# Patient Record
Sex: Male | Born: 1945 | Race: White | Hispanic: No | Marital: Married | Smoking: Former smoker
Health system: Southern US, Community
[De-identification: ages and names within clinical notes are randomized; demographics above are authoritative.]

## PROBLEM LIST (undated history)

## (undated) DIAGNOSIS — I10 Essential (primary) hypertension: Secondary | ICD-10-CM

## (undated) DIAGNOSIS — T7840XA Allergy, unspecified, initial encounter: Secondary | ICD-10-CM

## (undated) DIAGNOSIS — F329 Major depressive disorder, single episode, unspecified: Secondary | ICD-10-CM

## (undated) DIAGNOSIS — J45909 Unspecified asthma, uncomplicated: Secondary | ICD-10-CM

## (undated) DIAGNOSIS — F32A Depression, unspecified: Secondary | ICD-10-CM

## (undated) DIAGNOSIS — G4730 Sleep apnea, unspecified: Secondary | ICD-10-CM

## (undated) DIAGNOSIS — M1990 Unspecified osteoarthritis, unspecified site: Secondary | ICD-10-CM

## (undated) DIAGNOSIS — E785 Hyperlipidemia, unspecified: Secondary | ICD-10-CM

## (undated) DIAGNOSIS — I4819 Other persistent atrial fibrillation: Secondary | ICD-10-CM

## (undated) DIAGNOSIS — K432 Incisional hernia without obstruction or gangrene: Secondary | ICD-10-CM

## (undated) DIAGNOSIS — R7303 Prediabetes: Secondary | ICD-10-CM

## (undated) DIAGNOSIS — N209 Urinary calculus, unspecified: Secondary | ICD-10-CM

## (undated) DIAGNOSIS — J189 Pneumonia, unspecified organism: Secondary | ICD-10-CM

## (undated) DIAGNOSIS — N403 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: Secondary | ICD-10-CM

## (undated) HISTORY — DX: Major depressive disorder, single episode, unspecified: F32.9

## (undated) HISTORY — DX: Unspecified osteoarthritis, unspecified site: M19.90

## (undated) HISTORY — DX: Incisional hernia without obstruction or gangrene: K43.2

## (undated) HISTORY — DX: Depression, unspecified: F32.A

## (undated) HISTORY — PX: KNEE ARTHROSCOPY W/ ACL RECONSTRUCTION: SHX1858

## (undated) HISTORY — DX: Allergy, unspecified, initial encounter: T78.40XA

## (undated) HISTORY — PX: OTHER SURGICAL HISTORY: SHX169

## (undated) HISTORY — PX: TONSILLECTOMY: SUR1361

---

## 1989-11-04 HISTORY — PX: SPLENECTOMY: SUR1306

## 2006-07-21 ENCOUNTER — Ambulatory Visit: Payer: Self-pay | Admitting: Gastroenterology

## 2009-03-29 ENCOUNTER — Ambulatory Visit: Payer: Self-pay | Admitting: Internal Medicine

## 2011-07-06 HISTORY — PX: COLONOSCOPY: SHX174

## 2011-07-10 LAB — HM COLONOSCOPY: HM Colonoscopy: NORMAL

## 2011-07-25 HISTORY — PX: ELBOW SURGERY: SHX618

## 2011-09-12 ENCOUNTER — Telehealth: Payer: Self-pay | Admitting: Internal Medicine

## 2011-09-12 ENCOUNTER — Encounter: Payer: Self-pay | Admitting: Internal Medicine

## 2011-09-12 NOTE — Telephone Encounter (Signed)
Cell phone  [phone]   Pt wife (gayle) called to get James Logan in today ortomorrow he has sinus infection?  Nose/eyes burning run/teeth hurt/headache/chest congestion Please advise where i can put patient Pt has not been seen in this office yet

## 2011-09-12 NOTE — Telephone Encounter (Signed)
I talked to patients wife and I am waiting on her to call me back to see if patient wants to keep the appt that was made for tomorrow.

## 2011-09-13 ENCOUNTER — Encounter: Payer: Self-pay | Admitting: Internal Medicine

## 2011-09-13 ENCOUNTER — Ambulatory Visit (INDEPENDENT_AMBULATORY_CARE_PROVIDER_SITE_OTHER): Payer: Medicare Other | Admitting: Internal Medicine

## 2011-09-13 VITALS — BP 132/82 | HR 71 | Temp 98.4°F | Wt 174.0 lb

## 2011-09-13 DIAGNOSIS — J329 Chronic sinusitis, unspecified: Secondary | ICD-10-CM

## 2011-09-13 MED ORDER — AZITHROMYCIN 250 MG PO TABS: ORAL_TABLET | ORAL | Status: AC

## 2011-09-13 MED ORDER — GUAIFENESIN-CODEINE 100-10 MG/5ML PO SYRP: 5.0000 mL | ORAL_SOLUTION | Freq: Two times a day (BID) | ORAL | Status: DC | PRN

## 2011-09-13 NOTE — Telephone Encounter (Signed)
I tried to call patient again with no succuss.

## 2011-09-13 NOTE — Patient Instructions (Signed)
Call next week if symptoms not improving.

## 2011-09-13 NOTE — Progress Notes (Signed)
  Subjective:    Patient ID: James Logan, male    DOB: 1946/02/02, 65 y.o.   MRN: [id number]  Sinusitis This is a new problem. The current episode started 1 to 4 weeks ago. The problem has been gradually worsening since onset. The pain is moderate. Associated symptoms include congestion, coughing, shortness of breath, sinus pressure and a sore throat. Pertinent negatives include no chills, ear pain or neck pain. Past treatments include acetaminophen, oral decongestants and spray decongestants. The treatment provided no relief.     Outpatient Encounter Prescriptions as of 09/13/2011  Medication Sig Dispense Refill  . B Complex-C-Folic Acid (MULTIVITAMIN, STRESS FORMULA) tablet Take 1 tablet by mouth daily.        . citalopram (CELEXA) 20 MG tablet Take 20 mg by mouth daily.        . meloxicam (MOBIC) 15 MG tablet Take 15 mg by mouth daily.        [name] omeprazole (PRILOSEC OTC) 20 MG tablet Take 20 mg by mouth daily.          Review of Systems  Constitutional: Negative for fever, chills, activity change, appetite change, fatigue and unexpected weight change.  HENT: Positive for congestion, sore throat, rhinorrhea, postnasal drip and sinus pressure. Negative for ear pain and neck pain.   Eyes: Negative for visual disturbance.  Respiratory: Positive for cough and shortness of breath.   Cardiovascular: Negative for chest pain.  Gastrointestinal: Negative for abdominal pain and abdominal distention.  Skin: Negative for color change and rash.  Hematological: Negative for adenopathy.  Psychiatric/Behavioral: Negative for sleep disturbance.   BP 132/82  Pulse 71  Temp(Src) 98.4 F (36.9 C) (Oral)  Wt 174 lb (78.926 kg)  SpO2 94%     Objective:   Physical Exam  Constitutional: He is oriented to person, place, and time. He appears well-developed and well-nourished. No distress.  HENT:  Head: Normocephalic and atraumatic.  Right Ear: Tympanic membrane and external ear normal.  Left Ear:  External ear normal. A middle ear effusion is present.  Nose: Mucosal edema present.  Mouth/Throat: Oropharynx is clear and moist. No oropharyngeal exudate or posterior oropharyngeal erythema.  Eyes: Conjunctivae and EOM are normal. Pupils are equal, round, and reactive to light. Right eye exhibits no discharge. Left eye exhibits no discharge. No scleral icterus.  Neck: Normal range of motion. Neck supple. No tracheal deviation present. No thyromegaly present.  Cardiovascular: Normal rate, regular rhythm and normal heart sounds.  Exam reveals no gallop and no friction rub.   No murmur heard. Pulmonary/Chest: Effort normal and breath sounds normal. No respiratory distress. He has no wheezes. He has no rales. He exhibits no tenderness.  Musculoskeletal: Normal range of motion. He exhibits no edema.  Lymphadenopathy:    He has no cervical adenopathy.  Neurological: He is alert and oriented to person, place, and time. No cranial nerve deficit. Coordination normal.  Skin: Skin is warm and dry. No rash noted. He is not diaphoretic. No erythema. No pallor.  Psychiatric: He has a normal mood and affect. His behavior is normal. Judgment and thought content normal.          Assessment & Plan:  1. Sinusitis - Will treat with azithromycin.  Will continue mucinex and use codeine as needed for cough. Follow up if no improvement over next 24-48hr.

## 2011-10-14 ENCOUNTER — Telehealth: Payer: Self-pay | Admitting: Internal Medicine

## 2011-10-14 NOTE — Telephone Encounter (Signed)
ERROR

## 2011-10-14 NOTE — Telephone Encounter (Signed)
Left message asking patient to return my call.

## 2011-10-14 NOTE — Telephone Encounter (Signed)
Patient needing something else for his cough and cold . The z-pak did not work.

## 2011-10-14 NOTE — Telephone Encounter (Signed)
Patients wife called back and made an appt for Thursday.

## 2011-10-14 NOTE — Telephone Encounter (Signed)
Patient has been seen in the office for a cold and cough but not getting any better can something be called into the pharmacy to help. Patient wife will be at this number until 12:00 today.

## 2011-10-14 NOTE — Telephone Encounter (Signed)
Dr.  Dan Humphreys treated him over a month ago for sinus infectio so he cannot be treated with being seen

## 2011-10-17 ENCOUNTER — Ambulatory Visit (INDEPENDENT_AMBULATORY_CARE_PROVIDER_SITE_OTHER): Payer: Medicare Other | Admitting: Internal Medicine

## 2011-10-17 ENCOUNTER — Encounter: Payer: Self-pay | Admitting: Internal Medicine

## 2011-10-17 VITALS — BP 138/70 | HR 73 | Temp 98.4°F | Resp 18 | Ht 67.0 in | Wt 173.0 lb

## 2011-10-17 DIAGNOSIS — F32A Depression, unspecified: Secondary | ICD-10-CM

## 2011-10-17 DIAGNOSIS — F329 Major depressive disorder, single episode, unspecified: Secondary | ICD-10-CM

## 2011-10-17 DIAGNOSIS — J329 Chronic sinusitis, unspecified: Secondary | ICD-10-CM

## 2011-10-17 MED ORDER — PREDNISONE (PAK) 10 MG PO TABS: ORAL_TABLET | ORAL | Status: AC

## 2011-10-17 MED ORDER — HYDROCOD POLST-CHLORPHEN POLST 10-8 MG/5ML PO LQCR: 10.0000 mL | Freq: Every evening | ORAL | Status: DC | PRN

## 2011-10-17 MED ORDER — LEVOFLOXACIN 500 MG PO TABS: 500.0000 mg | ORAL_TABLET | Freq: Every day | ORAL | Status: AC

## 2011-10-17 MED ORDER — CLINDAMYCIN HCL 300 MG PO CAPS: 300.0000 mg | ORAL_CAPSULE | Freq: Three times a day (TID) | ORAL | Status: AC

## 2011-10-17 NOTE — Patient Instructions (Signed)
I am treating your for sinus infection again,  With stronger antibiotics .  The prednisone taper will help with inflammation and ear congestion  Take all of your antibiotics for 10 days

## 2011-10-17 NOTE — Progress Notes (Signed)
  Subjective:    Patient ID: James Logan, male    DOB: 10-Jun-1946, 65 y.o.   MRN: [id number]  HPI  TrMr. Logan is a 65 yr old white male with a history of depressive disorder. Managed with medications, who was treated last month for sinutiis with azithromycin but with incompletel resolution.  He states that his symptoms never improved significantly and reports persistent headache, purulent sinus discharge,  And nocutrnal coughing which is keeping him awake.  He feels general;y miserable but denies myalgias, fevers and dyspnea.   Past Medical History  Diagnosis Date  . Incisional hernia     ventral  . Arthritis   . Depression    Current Outpatient Prescriptions on File Prior to Visit  Medication Sig Dispense Refill  . B Complex-C-Folic Acid (MULTIVITAMIN, STRESS FORMULA) tablet Take 1 tablet by mouth daily.        . citalopram (CELEXA) 20 MG tablet Take 20 mg by mouth daily.        . meloxicam (MOBIC) 15 MG tablet Take 15 mg by mouth daily.        [name] omeprazole (PRILOSEC OTC) 20 MG tablet Take 20 mg by mouth daily.             Review of Systems  Constitutional: Negative for fever, chills, activity change, appetite change, fatigue and unexpected weight change.  HENT: Positive for congestion, sore throat, rhinorrhea, postnasal drip and sinus pressure. Negative for ear pain and neck pain.   Eyes: Negative for visual disturbance.  Respiratory: Positive for cough and shortness of breath.   Cardiovascular: Negative for chest pain.  Gastrointestinal: Negative for abdominal pain and abdominal distention.  Skin: Negative for color change and rash.  Hematological: Negative for adenopathy.  Psychiatric/Behavioral: Negative for sleep disturbance.          BP 138/70  Pulse 73  Temp(Src) 98.4 F (36.9 C) (Oral)  Resp 18  Ht 5\' 7"  (1.702 m)  Wt 173 lb (78.472 kg)  BMI 27.10 kg/m2  SpO2 95%  Objective:   Physical Exam  Constitutional: He is oriented to person, place, and time. He  appears well-developed and well-nourished. No distress.  HENT:  Head: Normocephalic and atraumatic.  Right Ear: Tympanic membrane and external ear normal.  Left Ear: External ear normal. A middle ear effusion is present.  Nose: Mucosal edema present.  Mouth/Throat: Oropharynx is clear and moist. No oropharyngeal exudate or posterior oropharyngeal erythema.  Eyes: Conjunctivae and EOM are normal. Pupils are equal, round, and reactive to light. Right eye exhibits no discharge. Left eye exhibits no discharge. No scleral icterus.  Neck: Normal range of motion. Neck supple. No tracheal deviation present. No thyromegaly present.  Cardiovascular: Normal rate, regular rhythm and normal heart sounds.  Exam reveals no gallop and no friction rub.   No murmur heard. Pulmonary/Chest: Effort normal and breath sounds normal. No respiratory distress. He has no wheezes. He has no rales. He exhibits no tenderness.  Musculoskeletal: Normal range of motion. He exhibits no edema.  Lymphadenopathy:    He has no cervical adenopathy.  Neurological: He is alert and oriented to person, place, and time. No cranial nerve deficit. Coordination normal.  Skin: Skin is warm and dry. No rash noted. He is not diaphoretic. No erythema. No pallor.  Psychiatric: He has a normal mood and affect. His behavior is normal. Judgment and thought content normal.          Assessment & Plan:

## 2011-10-18 DIAGNOSIS — F329 Major depressive disorder, single episode, unspecified: Secondary | ICD-10-CM | POA: Insufficient documentation

## 2011-10-18 DIAGNOSIS — F32A Depression, unspecified: Secondary | ICD-10-CM | POA: Insufficient documentation

## 2011-10-18 DIAGNOSIS — J0120 Acute ethmoidal sinusitis, unspecified: Secondary | ICD-10-CM | POA: Insufficient documentation

## 2011-10-18 MED ORDER — HYDROCOD POLST-CHLORPHEN POLST 10-8 MG/5ML PO LQCR: 10.0000 mL | Freq: Every evening | ORAL | Status: DC | PRN

## 2011-10-18 NOTE — Assessment & Plan Note (Signed)
Given the chronicity of his symptoms will treat for 10 days with levaquin and clindamycin and add prednisone  for the persistent inflammatory /congestion in his  Eustachian tubes.

## 2011-11-11 ENCOUNTER — Telehealth: Payer: Self-pay | Admitting: Internal Medicine

## 2011-11-11 DIAGNOSIS — J329 Chronic sinusitis, unspecified: Secondary | ICD-10-CM

## 2011-11-11 NOTE — Telephone Encounter (Signed)
I spoke w/wife - Pt was treated w/levaquin & prednisone pak at last OV. Says symptoms became somewhat better but not completely resolved and now are getting worse. C/o productive cough and sinus congestion/pressure w/yellow mucus. NO fever. Pt states that the only day he can come in for OV is tomorrow but there are no open apts. Would you like to work in pt tomorrow?

## 2011-11-11 NOTE — Telephone Encounter (Signed)
No I think he needs to see an ENT doc because he has been through 2 rounds of antibiotics now and he may have a sinus abscess or blocked sinus that is causing the recurrence.  Does he have a preference?

## 2011-11-12 NOTE — Telephone Encounter (Signed)
Referral to [facility] made in [facility]

## 2011-11-12 NOTE — Telephone Encounter (Signed)
Spoke with patient's wife and she says that she doesn't really have a preference, but she did mention [facility] in [place] with Dr. Nelda Marseille.

## 2011-11-12 NOTE — Telephone Encounter (Signed)
Patient's wife notified that referral was being done. Will wait to here from [facility].

## 2011-11-18 DIAGNOSIS — J320 Chronic maxillary sinusitis: Secondary | ICD-10-CM | POA: Diagnosis not present

## 2011-11-18 DIAGNOSIS — R0982 Postnasal drip: Secondary | ICD-10-CM | POA: Diagnosis not present

## 2011-11-18 DIAGNOSIS — R05 Cough: Secondary | ICD-10-CM | POA: Diagnosis not present

## 2011-11-18 DIAGNOSIS — R059 Cough, unspecified: Secondary | ICD-10-CM | POA: Diagnosis not present

## 2011-11-26 DIAGNOSIS — M999 Biomechanical lesion, unspecified: Secondary | ICD-10-CM | POA: Diagnosis not present

## 2011-11-26 DIAGNOSIS — IMO0002 Reserved for concepts with insufficient information to code with codable children: Secondary | ICD-10-CM | POA: Diagnosis not present

## 2011-11-26 DIAGNOSIS — M47814 Spondylosis without myelopathy or radiculopathy, thoracic region: Secondary | ICD-10-CM | POA: Diagnosis not present

## 2011-12-11 ENCOUNTER — Encounter: Payer: Self-pay | Admitting: Internal Medicine

## 2012-01-07 DIAGNOSIS — IMO0002 Reserved for concepts with insufficient information to code with codable children: Secondary | ICD-10-CM | POA: Diagnosis not present

## 2012-01-07 DIAGNOSIS — M999 Biomechanical lesion, unspecified: Secondary | ICD-10-CM | POA: Diagnosis not present

## 2012-01-07 DIAGNOSIS — M47814 Spondylosis without myelopathy or radiculopathy, thoracic region: Secondary | ICD-10-CM | POA: Diagnosis not present

## 2012-01-14 DIAGNOSIS — M999 Biomechanical lesion, unspecified: Secondary | ICD-10-CM | POA: Diagnosis not present

## 2012-01-14 DIAGNOSIS — M47814 Spondylosis without myelopathy or radiculopathy, thoracic region: Secondary | ICD-10-CM | POA: Diagnosis not present

## 2012-01-14 DIAGNOSIS — IMO0002 Reserved for concepts with insufficient information to code with codable children: Secondary | ICD-10-CM | POA: Diagnosis not present

## 2012-02-11 DIAGNOSIS — M47814 Spondylosis without myelopathy or radiculopathy, thoracic region: Secondary | ICD-10-CM | POA: Diagnosis not present

## 2012-02-11 DIAGNOSIS — IMO0002 Reserved for concepts with insufficient information to code with codable children: Secondary | ICD-10-CM | POA: Diagnosis not present

## 2012-02-11 DIAGNOSIS — M999 Biomechanical lesion, unspecified: Secondary | ICD-10-CM | POA: Diagnosis not present

## 2012-03-10 DIAGNOSIS — M999 Biomechanical lesion, unspecified: Secondary | ICD-10-CM | POA: Diagnosis not present

## 2012-03-10 DIAGNOSIS — IMO0002 Reserved for concepts with insufficient information to code with codable children: Secondary | ICD-10-CM | POA: Diagnosis not present

## 2012-03-10 DIAGNOSIS — M47814 Spondylosis without myelopathy or radiculopathy, thoracic region: Secondary | ICD-10-CM | POA: Diagnosis not present

## 2012-03-27 DIAGNOSIS — H169 Unspecified keratitis: Secondary | ICD-10-CM | POA: Diagnosis not present

## 2012-04-01 DIAGNOSIS — H169 Unspecified keratitis: Secondary | ICD-10-CM | POA: Diagnosis not present

## 2012-04-07 DIAGNOSIS — IMO0002 Reserved for concepts with insufficient information to code with codable children: Secondary | ICD-10-CM | POA: Diagnosis not present

## 2012-04-07 DIAGNOSIS — M999 Biomechanical lesion, unspecified: Secondary | ICD-10-CM | POA: Diagnosis not present

## 2012-04-07 DIAGNOSIS — M47814 Spondylosis without myelopathy or radiculopathy, thoracic region: Secondary | ICD-10-CM | POA: Diagnosis not present

## 2012-04-13 DIAGNOSIS — H1045 Other chronic allergic conjunctivitis: Secondary | ICD-10-CM | POA: Diagnosis not present

## 2012-04-20 DIAGNOSIS — H2510 Age-related nuclear cataract, unspecified eye: Secondary | ICD-10-CM | POA: Diagnosis not present

## 2012-04-28 DIAGNOSIS — M999 Biomechanical lesion, unspecified: Secondary | ICD-10-CM | POA: Diagnosis not present

## 2012-04-28 DIAGNOSIS — IMO0002 Reserved for concepts with insufficient information to code with codable children: Secondary | ICD-10-CM | POA: Diagnosis not present

## 2012-04-28 DIAGNOSIS — M47814 Spondylosis without myelopathy or radiculopathy, thoracic region: Secondary | ICD-10-CM | POA: Diagnosis not present

## 2012-05-04 DIAGNOSIS — M999 Biomechanical lesion, unspecified: Secondary | ICD-10-CM | POA: Diagnosis not present

## 2012-05-04 DIAGNOSIS — IMO0002 Reserved for concepts with insufficient information to code with codable children: Secondary | ICD-10-CM | POA: Diagnosis not present

## 2012-05-04 DIAGNOSIS — M47814 Spondylosis without myelopathy or radiculopathy, thoracic region: Secondary | ICD-10-CM | POA: Diagnosis not present

## 2012-05-12 DIAGNOSIS — M531 Cervicobrachial syndrome: Secondary | ICD-10-CM | POA: Diagnosis not present

## 2012-05-12 DIAGNOSIS — M999 Biomechanical lesion, unspecified: Secondary | ICD-10-CM | POA: Diagnosis not present

## 2012-05-12 DIAGNOSIS — IMO0002 Reserved for concepts with insufficient information to code with codable children: Secondary | ICD-10-CM | POA: Diagnosis not present

## 2012-05-12 DIAGNOSIS — M47814 Spondylosis without myelopathy or radiculopathy, thoracic region: Secondary | ICD-10-CM | POA: Diagnosis not present

## 2012-05-12 DIAGNOSIS — M9981 Other biomechanical lesions of cervical region: Secondary | ICD-10-CM | POA: Diagnosis not present

## 2012-05-14 DIAGNOSIS — M531 Cervicobrachial syndrome: Secondary | ICD-10-CM | POA: Diagnosis not present

## 2012-05-14 DIAGNOSIS — M999 Biomechanical lesion, unspecified: Secondary | ICD-10-CM | POA: Diagnosis not present

## 2012-05-14 DIAGNOSIS — M9981 Other biomechanical lesions of cervical region: Secondary | ICD-10-CM | POA: Diagnosis not present

## 2012-05-14 DIAGNOSIS — IMO0002 Reserved for concepts with insufficient information to code with codable children: Secondary | ICD-10-CM | POA: Diagnosis not present

## 2012-05-18 DIAGNOSIS — M999 Biomechanical lesion, unspecified: Secondary | ICD-10-CM | POA: Diagnosis not present

## 2012-05-18 DIAGNOSIS — IMO0002 Reserved for concepts with insufficient information to code with codable children: Secondary | ICD-10-CM | POA: Diagnosis not present

## 2012-05-18 DIAGNOSIS — M9981 Other biomechanical lesions of cervical region: Secondary | ICD-10-CM | POA: Diagnosis not present

## 2012-05-18 DIAGNOSIS — M531 Cervicobrachial syndrome: Secondary | ICD-10-CM | POA: Diagnosis not present

## 2012-05-21 DIAGNOSIS — M531 Cervicobrachial syndrome: Secondary | ICD-10-CM | POA: Diagnosis not present

## 2012-05-21 DIAGNOSIS — IMO0002 Reserved for concepts with insufficient information to code with codable children: Secondary | ICD-10-CM | POA: Diagnosis not present

## 2012-05-21 DIAGNOSIS — M9981 Other biomechanical lesions of cervical region: Secondary | ICD-10-CM | POA: Diagnosis not present

## 2012-05-21 DIAGNOSIS — M999 Biomechanical lesion, unspecified: Secondary | ICD-10-CM | POA: Diagnosis not present

## 2012-05-26 DIAGNOSIS — M999 Biomechanical lesion, unspecified: Secondary | ICD-10-CM | POA: Diagnosis not present

## 2012-05-26 DIAGNOSIS — IMO0002 Reserved for concepts with insufficient information to code with codable children: Secondary | ICD-10-CM | POA: Diagnosis not present

## 2012-05-26 DIAGNOSIS — M531 Cervicobrachial syndrome: Secondary | ICD-10-CM | POA: Diagnosis not present

## 2012-05-26 DIAGNOSIS — M9981 Other biomechanical lesions of cervical region: Secondary | ICD-10-CM | POA: Diagnosis not present

## 2012-06-09 DIAGNOSIS — M999 Biomechanical lesion, unspecified: Secondary | ICD-10-CM | POA: Diagnosis not present

## 2012-06-09 DIAGNOSIS — IMO0002 Reserved for concepts with insufficient information to code with codable children: Secondary | ICD-10-CM | POA: Diagnosis not present

## 2012-06-09 DIAGNOSIS — M531 Cervicobrachial syndrome: Secondary | ICD-10-CM | POA: Diagnosis not present

## 2012-06-09 DIAGNOSIS — M9981 Other biomechanical lesions of cervical region: Secondary | ICD-10-CM | POA: Diagnosis not present

## 2012-06-22 ENCOUNTER — Encounter: Payer: Self-pay | Admitting: Internal Medicine

## 2012-06-22 ENCOUNTER — Ambulatory Visit (INDEPENDENT_AMBULATORY_CARE_PROVIDER_SITE_OTHER): Payer: Medicare Other | Admitting: Internal Medicine

## 2012-06-22 VITALS — BP 152/80 | HR 62 | Temp 97.9°F | Resp 18 | Wt 171.5 lb

## 2012-06-22 DIAGNOSIS — R5383 Other fatigue: Secondary | ICD-10-CM | POA: Diagnosis not present

## 2012-06-22 DIAGNOSIS — E785 Hyperlipidemia, unspecified: Secondary | ICD-10-CM | POA: Diagnosis not present

## 2012-06-22 DIAGNOSIS — R5381 Other malaise: Secondary | ICD-10-CM

## 2012-06-22 DIAGNOSIS — K219 Gastro-esophageal reflux disease without esophagitis: Secondary | ICD-10-CM

## 2012-06-22 DIAGNOSIS — K449 Diaphragmatic hernia without obstruction or gangrene: Secondary | ICD-10-CM

## 2012-06-22 DIAGNOSIS — Z1159 Encounter for screening for other viral diseases: Secondary | ICD-10-CM

## 2012-06-22 DIAGNOSIS — K439 Ventral hernia without obstruction or gangrene: Secondary | ICD-10-CM | POA: Insufficient documentation

## 2012-06-22 LAB — LIPID PANEL
Cholesterol: 189 mg/dL (ref 0–200)
LDL Cholesterol: 108 mg/dL — ABNORMAL HIGH (ref 0–99)
Triglycerides: 179 mg/dL — ABNORMAL HIGH (ref 0.0–149.0)
VLDL: 35.8 mg/dL (ref 0.0–40.0)

## 2012-06-22 LAB — COMPREHENSIVE METABOLIC PANEL
AST: 33 U/L (ref 0–37)
Albumin: 4.2 g/dL (ref 3.5–5.2)
BUN: 24 mg/dL — ABNORMAL HIGH (ref 6–23)
Calcium: 10.2 mg/dL (ref 8.4–10.5)
Chloride: 104 mEq/L (ref 96–112)
Creatinine, Ser: 1 mg/dL (ref 0.4–1.5)
Glucose, Bld: 106 mg/dL — ABNORMAL HIGH (ref 70–99)
Potassium: 4.7 mEq/L (ref 3.5–5.1)

## 2012-06-22 LAB — CBC WITH DIFFERENTIAL/PLATELET
Basophils Relative: 0.8 % (ref 0.0–3.0)
Eosinophils Relative: 2.7 % (ref 0.0–5.0)
Hemoglobin: 16.4 g/dL (ref 13.0–17.0)
Lymphocytes Relative: 42.2 % (ref 12.0–46.0)
Monocytes Relative: 12.5 % — ABNORMAL HIGH (ref 3.0–12.0)
Neutro Abs: 3.3 10*3/uL (ref 1.4–7.7)
Neutrophils Relative %: 41.8 % — ABNORMAL LOW (ref 43.0–77.0)
RBC: 4.89 Mil/uL (ref 4.22–5.81)
WBC: 7.9 10*3/uL (ref 4.5–10.5)

## 2012-06-22 LAB — TSH: TSH: 1.78 u[IU]/mL (ref 0.35–5.50)

## 2012-06-22 MED ORDER — OMEPRAZOLE MAGNESIUM 20 MG PO TBEC: 20.0000 mg | DELAYED_RELEASE_TABLET | Freq: Two times a day (BID) | ORAL | Status: DC

## 2012-06-22 MED ORDER — AZELASTINE HCL 0.15 % NA SOLN: 1.0000 | Freq: Every day | NASAL | Status: DC

## 2012-06-22 MED ORDER — OMEPRAZOLE MAGNESIUM 20 MG PO TBEC: 20.0000 mg | DELAYED_RELEASE_TABLET | Freq: Every day | ORAL | Status: DC

## 2012-06-22 NOTE — Assessment & Plan Note (Signed)
Omeprazole increased to twice daily. Small meals with frequent feeds recommended. Elevating head of the bed and avoiding eating within 2 hours of going to bed all again reinforced. Referral to Dr. Lemar Livings for surgical intervention requested.

## 2012-06-22 NOTE — Patient Instructions (Addendum)
We referring you to Dr. Lemar Livings  For evaluation of your hernias.    We will call you with your lab results.

## 2012-06-22 NOTE — Progress Notes (Signed)
Patient ID: James Logan, male   DOB: 02-Aug-1946, 66 y.o.   MRN: [id number]  Patient Active Problem List  Diagnosis  . Depression  . Chronic sinusitis  . Ventral hernia without obstruction or gangrene  . Hiatal hernia with gastroesophageal reflux    Subjective:  CC:   Chief Complaint  Patient presents with  . Follow-up  . Referral    HPI:   James Logan a 66 y.o. male who presents Abdominal swelling and persistent reflux. Patient presents today with wife with a complaint of a progressively enlarging ventral wall mass. 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  He states that the event he developed a ventral hernia  Around the year 2000  and it has been enlarging over the last several years and he is now concerned about it its size. It is not particular painful .   His second issue is persistent reflux with belching caused by hiatal hernia.   He takes omeprazole daily and has reduce his starches in his diet but continues to have reflux aggravated by bending over.  he is requesting surgical management of both hernias by Dr. Santa Genera.    Past Medical History  Diagnosis Date  . Incisional hernia     ventral  . Arthritis   . Depression     Past Surgical History  Procedure Date  . 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    Review of Systems:   A comprehensive ROS was done and positive for reflex and abdominal distension .   The rest was negative.   History   Social History  . Marital Status: Married    Spouse Name: N/A    Number of Children: N/A  . Years of Education: N/A   Occupational History  . Not on file.     Social History Main Topics  . Smoking status: Former Smoker    Quit date: 10/16/1961  . Smokeless tobacco: Never Used  . Alcohol Use: Yes  . Drug Use: No  . Sexually Active: Not on file   Other Topics Concern  . Not on file   Social History Narrative  . No narrative on file    Objective:  BP 152/80  Pulse 62  Temp 97.9 F (36.6 C) (Oral)  Resp 18  Wt 171 lb 8 oz (77.792 kg)  SpO2 95%  General appearance: alert, cooperative and appears stated age Ears: normal TM's and external ear canals both ears Throat: lips, mucosa, and tongue normal; teeth and gums normal Neck: no adenopathy, no carotid bruit, supple, symmetrical, trachea midline and thyroid not enlarged, symmetric, no tenderness/mass/nodules Back: symmetric, no curvature. ROM normal. No CVA tenderness. Lungs: clear to auscultation bilaterally Heart: regular rate and rhythm, S1, S2 normal, no murmur, click, rub or gallop Abdomen: soft, non-tender; bowel sounds normal; no masses,  no organomegaly Pulses: 2+ and symmetric Skin: Skin color, texture, turgor normal. No rashes or lesions Lymph nodes: Cervical, supraclavicular, and axillary nodes normal.  Assessment and Plan:  Ventral hernia without obstruction or gangrene Referral to [facility] for management of large nonreducible hernia which resulted  from abdominal surgery many years ago. This may require referral to her hernia specialist given its size.  Hiatal hernia with gastroesophageal reflux Omeprazole increased to twice daily. Small meals with frequent feeds recommended. Elevating head of the bed and avoiding eating within 2 hours of going to bed all again reinforced. Referral to Dr. Lemar Livings for surgical intervention requested.   Updated Medication List Outpatient Encounter Prescriptions as of 06/22/2012  Medication Sig Dispense Refill  . B Complex-C-Folic Acid (MULTIVITAMIN, STRESS FORMULA) tablet Take 1 tablet by mouth daily.        . citalopram  (CELEXA) 20 MG tablet Take 20 mg by mouth daily.        . meloxicam (MOBIC) 15 MG tablet Take 15 mg by mouth daily.        [name] omeprazole (PRILOSEC OTC) 20 MG tablet Take 1 tablet (20 mg total) by mouth 2 (two) times daily before a meal. Prior to meals  60 tablet  5  . DISCONTD: omeprazole (PRILOSEC OTC) 20 MG tablet Take 20 mg by mouth daily.       [name] DISCONTD: omeprazole (PRILOSEC OTC) 20 MG tablet Take 1 tablet (20 mg total) by mouth daily. Prior to meals  60 tablet  5  . Azelastine HCl 0.15 % SOLN Place 1 Squirt into the nose daily.  30 mL  11  . DISCONTD: chlorpheniramine-HYDROcodone (TUSSIONEX PENNKINETIC ER) 10-8 MG/5ML LQCR Take 10 mLs by mouth at bedtime as needed (cough).  115 mL  0     Orders Placed This Encounter  Procedures  . Lipid panel  . Comprehensive metabolic panel  . CBC with Differential  . Testosterone, free, total  . TSH  . Hepatitis C Antibody  . Ambulatory referral to General Surgery    No Follow-up on file.

## 2012-06-22 NOTE — Assessment & Plan Note (Signed)
Referral to [facility] for management of large nonreducible hernia which resulted from abdominal surgery many years ago. This may require referral to her hernia specialist given its size.

## 2012-06-23 LAB — TESTOSTERONE, FREE, TOTAL, SHBG
Sex Hormone Binding: 30 nmol/L (ref 13–71)
Testosterone, Free: 65.4 pg/mL (ref 47.0–244.0)
Testosterone-% Free: 2.1 % (ref 1.6–2.9)

## 2012-06-23 LAB — HEPATITIS C ANTIBODY: HCV Ab: NEGATIVE

## 2012-06-25 ENCOUNTER — Other Ambulatory Visit: Payer: Self-pay | Admitting: Internal Medicine

## 2012-06-26 ENCOUNTER — Telehealth: Payer: Self-pay | Admitting: Internal Medicine

## 2012-06-26 DIAGNOSIS — J310 Chronic rhinitis: Secondary | ICD-10-CM

## 2012-06-26 MED ORDER — AZELASTINE HCL 0.15 % NA SOLN: NASAL | Status: DC

## 2012-06-26 NOTE — Telephone Encounter (Signed)
Brooke from [employer] called and stated the insurance will not pay for the Astepro nasal spray because the sig is only for once a day.  She wanted to know if you could change the sig to say: 1-2 sprays into each nostril twice daily.  Please advise and I will update chart with new Rx.

## 2012-06-26 NOTE — Telephone Encounter (Signed)
Non need,  Sent new corrected rx via [facility].  thanks

## 2012-06-30 DIAGNOSIS — IMO0002 Reserved for concepts with insufficient information to code with codable children: Secondary | ICD-10-CM | POA: Diagnosis not present

## 2012-06-30 DIAGNOSIS — M9981 Other biomechanical lesions of cervical region: Secondary | ICD-10-CM | POA: Diagnosis not present

## 2012-06-30 DIAGNOSIS — M999 Biomechanical lesion, unspecified: Secondary | ICD-10-CM | POA: Diagnosis not present

## 2012-06-30 DIAGNOSIS — M531 Cervicobrachial syndrome: Secondary | ICD-10-CM | POA: Diagnosis not present

## 2012-07-13 DIAGNOSIS — K439 Ventral hernia without obstruction or gangrene: Secondary | ICD-10-CM | POA: Diagnosis not present

## 2012-07-13 DIAGNOSIS — M999 Biomechanical lesion, unspecified: Secondary | ICD-10-CM | POA: Diagnosis not present

## 2012-07-13 DIAGNOSIS — IMO0002 Reserved for concepts with insufficient information to code with codable children: Secondary | ICD-10-CM | POA: Diagnosis not present

## 2012-07-13 DIAGNOSIS — K219 Gastro-esophageal reflux disease without esophagitis: Secondary | ICD-10-CM | POA: Diagnosis not present

## 2012-07-13 DIAGNOSIS — M531 Cervicobrachial syndrome: Secondary | ICD-10-CM | POA: Diagnosis not present

## 2012-07-13 DIAGNOSIS — Z23 Encounter for immunization: Secondary | ICD-10-CM | POA: Diagnosis not present

## 2012-07-13 DIAGNOSIS — M9981 Other biomechanical lesions of cervical region: Secondary | ICD-10-CM | POA: Diagnosis not present

## 2012-07-16 DIAGNOSIS — M9981 Other biomechanical lesions of cervical region: Secondary | ICD-10-CM | POA: Diagnosis not present

## 2012-07-16 DIAGNOSIS — M531 Cervicobrachial syndrome: Secondary | ICD-10-CM | POA: Diagnosis not present

## 2012-07-16 DIAGNOSIS — M999 Biomechanical lesion, unspecified: Secondary | ICD-10-CM | POA: Diagnosis not present

## 2012-07-16 DIAGNOSIS — IMO0002 Reserved for concepts with insufficient information to code with codable children: Secondary | ICD-10-CM | POA: Diagnosis not present

## 2012-07-22 DIAGNOSIS — L570 Actinic keratosis: Secondary | ICD-10-CM | POA: Diagnosis not present

## 2012-07-22 DIAGNOSIS — D485 Neoplasm of uncertain behavior of skin: Secondary | ICD-10-CM | POA: Diagnosis not present

## 2012-07-22 DIAGNOSIS — L821 Other seborrheic keratosis: Secondary | ICD-10-CM | POA: Diagnosis not present

## 2012-07-27 ENCOUNTER — Ambulatory Visit: Payer: Self-pay | Admitting: General Surgery

## 2012-07-27 DIAGNOSIS — R109 Unspecified abdominal pain: Secondary | ICD-10-CM | POA: Diagnosis not present

## 2012-07-27 DIAGNOSIS — K219 Gastro-esophageal reflux disease without esophagitis: Secondary | ICD-10-CM | POA: Diagnosis not present

## 2012-08-06 DIAGNOSIS — M999 Biomechanical lesion, unspecified: Secondary | ICD-10-CM | POA: Diagnosis not present

## 2012-08-06 DIAGNOSIS — M531 Cervicobrachial syndrome: Secondary | ICD-10-CM | POA: Diagnosis not present

## 2012-08-06 DIAGNOSIS — M9981 Other biomechanical lesions of cervical region: Secondary | ICD-10-CM | POA: Diagnosis not present

## 2012-08-06 DIAGNOSIS — IMO0002 Reserved for concepts with insufficient information to code with codable children: Secondary | ICD-10-CM | POA: Diagnosis not present

## 2012-08-11 ENCOUNTER — Ambulatory Visit: Payer: Self-pay | Admitting: General Surgery

## 2012-08-11 DIAGNOSIS — K2100 Gastro-esophageal reflux disease with esophagitis, without bleeding: Secondary | ICD-10-CM | POA: Diagnosis not present

## 2012-08-11 DIAGNOSIS — K2940 Chronic atrophic gastritis without bleeding: Secondary | ICD-10-CM | POA: Diagnosis not present

## 2012-08-11 DIAGNOSIS — K2970 Gastritis, unspecified, without bleeding: Secondary | ICD-10-CM | POA: Diagnosis not present

## 2012-08-11 DIAGNOSIS — A048 Other specified bacterial intestinal infections: Secondary | ICD-10-CM | POA: Diagnosis not present

## 2012-08-11 DIAGNOSIS — K219 Gastro-esophageal reflux disease without esophagitis: Secondary | ICD-10-CM | POA: Diagnosis not present

## 2012-08-11 DIAGNOSIS — Z79899 Other long term (current) drug therapy: Secondary | ICD-10-CM | POA: Diagnosis not present

## 2012-08-11 DIAGNOSIS — K449 Diaphragmatic hernia without obstruction or gangrene: Secondary | ICD-10-CM | POA: Diagnosis not present

## 2012-08-11 DIAGNOSIS — K2990 Gastroduodenitis, unspecified, without bleeding: Secondary | ICD-10-CM | POA: Diagnosis not present

## 2012-08-11 DIAGNOSIS — K2960 Other gastritis without bleeding: Secondary | ICD-10-CM | POA: Diagnosis not present

## 2012-08-13 LAB — PATHOLOGY REPORT

## 2012-08-24 DIAGNOSIS — K2940 Chronic atrophic gastritis without bleeding: Secondary | ICD-10-CM | POA: Diagnosis not present

## 2012-08-24 DIAGNOSIS — K439 Ventral hernia without obstruction or gangrene: Secondary | ICD-10-CM | POA: Diagnosis not present

## 2012-08-24 DIAGNOSIS — K2100 Gastro-esophageal reflux disease with esophagitis, without bleeding: Secondary | ICD-10-CM | POA: Diagnosis not present

## 2012-08-31 DIAGNOSIS — IMO0002 Reserved for concepts with insufficient information to code with codable children: Secondary | ICD-10-CM | POA: Diagnosis not present

## 2012-08-31 DIAGNOSIS — M9981 Other biomechanical lesions of cervical region: Secondary | ICD-10-CM | POA: Diagnosis not present

## 2012-08-31 DIAGNOSIS — M999 Biomechanical lesion, unspecified: Secondary | ICD-10-CM | POA: Diagnosis not present

## 2012-08-31 DIAGNOSIS — M531 Cervicobrachial syndrome: Secondary | ICD-10-CM | POA: Diagnosis not present

## 2012-09-03 ENCOUNTER — Ambulatory Visit: Payer: Self-pay | Admitting: General Surgery

## 2012-09-03 DIAGNOSIS — K439 Ventral hernia without obstruction or gangrene: Secondary | ICD-10-CM | POA: Diagnosis not present

## 2012-09-09 ENCOUNTER — Ambulatory Visit: Payer: Self-pay | Admitting: General Surgery

## 2012-09-09 DIAGNOSIS — Z01810 Encounter for preprocedural cardiovascular examination: Secondary | ICD-10-CM

## 2012-09-09 DIAGNOSIS — K439 Ventral hernia without obstruction or gangrene: Secondary | ICD-10-CM | POA: Diagnosis not present

## 2012-09-11 ENCOUNTER — Telehealth: Payer: Self-pay | Admitting: Internal Medicine

## 2012-09-11 NOTE — Telephone Encounter (Signed)
Pt was wanting to speak to Dr. Darrick Huntsman today if possible ??? About a hernia surgery he will be having. He would like a call back to his home phone.

## 2012-09-21 ENCOUNTER — Encounter: Payer: Self-pay | Admitting: Internal Medicine

## 2012-09-30 DIAGNOSIS — M9981 Other biomechanical lesions of cervical region: Secondary | ICD-10-CM | POA: Diagnosis not present

## 2012-09-30 DIAGNOSIS — IMO0002 Reserved for concepts with insufficient information to code with codable children: Secondary | ICD-10-CM | POA: Diagnosis not present

## 2012-09-30 DIAGNOSIS — M999 Biomechanical lesion, unspecified: Secondary | ICD-10-CM | POA: Diagnosis not present

## 2012-09-30 DIAGNOSIS — M531 Cervicobrachial syndrome: Secondary | ICD-10-CM | POA: Diagnosis not present

## 2012-10-21 DIAGNOSIS — K431 Incisional hernia with gangrene: Secondary | ICD-10-CM | POA: Diagnosis not present

## 2012-10-27 DIAGNOSIS — M999 Biomechanical lesion, unspecified: Secondary | ICD-10-CM | POA: Diagnosis not present

## 2012-10-27 DIAGNOSIS — M9981 Other biomechanical lesions of cervical region: Secondary | ICD-10-CM | POA: Diagnosis not present

## 2012-10-27 DIAGNOSIS — M531 Cervicobrachial syndrome: Secondary | ICD-10-CM | POA: Diagnosis not present

## 2012-10-27 DIAGNOSIS — IMO0002 Reserved for concepts with insufficient information to code with codable children: Secondary | ICD-10-CM | POA: Diagnosis not present

## 2012-11-17 DIAGNOSIS — IMO0002 Reserved for concepts with insufficient information to code with codable children: Secondary | ICD-10-CM | POA: Diagnosis not present

## 2012-11-17 DIAGNOSIS — M9981 Other biomechanical lesions of cervical region: Secondary | ICD-10-CM | POA: Diagnosis not present

## 2012-11-17 DIAGNOSIS — M999 Biomechanical lesion, unspecified: Secondary | ICD-10-CM | POA: Diagnosis not present

## 2012-11-17 DIAGNOSIS — M531 Cervicobrachial syndrome: Secondary | ICD-10-CM | POA: Diagnosis not present

## 2012-11-20 DIAGNOSIS — R1084 Generalized abdominal pain: Secondary | ICD-10-CM | POA: Diagnosis not present

## 2012-11-20 DIAGNOSIS — K660 Peritoneal adhesions (postprocedural) (postinfection): Secondary | ICD-10-CM | POA: Diagnosis not present

## 2012-11-20 DIAGNOSIS — K439 Ventral hernia without obstruction or gangrene: Secondary | ICD-10-CM | POA: Diagnosis not present

## 2012-11-20 DIAGNOSIS — K432 Incisional hernia without obstruction or gangrene: Secondary | ICD-10-CM | POA: Diagnosis not present

## 2012-11-20 DIAGNOSIS — Z87442 Personal history of urinary calculi: Secondary | ICD-10-CM | POA: Diagnosis not present

## 2012-11-20 DIAGNOSIS — K219 Gastro-esophageal reflux disease without esophagitis: Secondary | ICD-10-CM | POA: Diagnosis not present

## 2012-11-20 DIAGNOSIS — G8918 Other acute postprocedural pain: Secondary | ICD-10-CM | POA: Diagnosis not present

## 2012-11-23 HISTORY — PX: HERNIA REPAIR: SHX51

## 2012-12-07 DIAGNOSIS — K432 Incisional hernia without obstruction or gangrene: Secondary | ICD-10-CM | POA: Diagnosis not present

## 2013-01-06 DIAGNOSIS — K432 Incisional hernia without obstruction or gangrene: Secondary | ICD-10-CM | POA: Diagnosis not present

## 2013-01-07 DIAGNOSIS — M9981 Other biomechanical lesions of cervical region: Secondary | ICD-10-CM | POA: Diagnosis not present

## 2013-01-07 DIAGNOSIS — IMO0002 Reserved for concepts with insufficient information to code with codable children: Secondary | ICD-10-CM | POA: Diagnosis not present

## 2013-01-07 DIAGNOSIS — M999 Biomechanical lesion, unspecified: Secondary | ICD-10-CM | POA: Diagnosis not present

## 2013-01-07 DIAGNOSIS — M531 Cervicobrachial syndrome: Secondary | ICD-10-CM | POA: Diagnosis not present

## 2013-01-12 DIAGNOSIS — M9981 Other biomechanical lesions of cervical region: Secondary | ICD-10-CM | POA: Diagnosis not present

## 2013-01-12 DIAGNOSIS — M5430 Sciatica, unspecified side: Secondary | ICD-10-CM | POA: Diagnosis not present

## 2013-01-12 DIAGNOSIS — M531 Cervicobrachial syndrome: Secondary | ICD-10-CM | POA: Diagnosis not present

## 2013-01-12 DIAGNOSIS — IMO0002 Reserved for concepts with insufficient information to code with codable children: Secondary | ICD-10-CM | POA: Diagnosis not present

## 2013-01-12 DIAGNOSIS — M999 Biomechanical lesion, unspecified: Secondary | ICD-10-CM | POA: Diagnosis not present

## 2013-01-15 DIAGNOSIS — IMO0002 Reserved for concepts with insufficient information to code with codable children: Secondary | ICD-10-CM | POA: Diagnosis not present

## 2013-01-15 DIAGNOSIS — M5430 Sciatica, unspecified side: Secondary | ICD-10-CM | POA: Diagnosis not present

## 2013-01-15 DIAGNOSIS — M999 Biomechanical lesion, unspecified: Secondary | ICD-10-CM | POA: Diagnosis not present

## 2013-01-15 DIAGNOSIS — M9981 Other biomechanical lesions of cervical region: Secondary | ICD-10-CM | POA: Diagnosis not present

## 2013-01-15 DIAGNOSIS — M531 Cervicobrachial syndrome: Secondary | ICD-10-CM | POA: Diagnosis not present

## 2013-01-26 DIAGNOSIS — M5430 Sciatica, unspecified side: Secondary | ICD-10-CM | POA: Diagnosis not present

## 2013-01-26 DIAGNOSIS — IMO0002 Reserved for concepts with insufficient information to code with codable children: Secondary | ICD-10-CM | POA: Diagnosis not present

## 2013-01-26 DIAGNOSIS — M999 Biomechanical lesion, unspecified: Secondary | ICD-10-CM | POA: Diagnosis not present

## 2013-01-26 DIAGNOSIS — M531 Cervicobrachial syndrome: Secondary | ICD-10-CM | POA: Diagnosis not present

## 2013-01-26 DIAGNOSIS — M9981 Other biomechanical lesions of cervical region: Secondary | ICD-10-CM | POA: Diagnosis not present

## 2013-02-09 DIAGNOSIS — M5430 Sciatica, unspecified side: Secondary | ICD-10-CM | POA: Diagnosis not present

## 2013-02-09 DIAGNOSIS — M999 Biomechanical lesion, unspecified: Secondary | ICD-10-CM | POA: Diagnosis not present

## 2013-02-09 DIAGNOSIS — M531 Cervicobrachial syndrome: Secondary | ICD-10-CM | POA: Diagnosis not present

## 2013-02-09 DIAGNOSIS — IMO0002 Reserved for concepts with insufficient information to code with codable children: Secondary | ICD-10-CM | POA: Diagnosis not present

## 2013-02-09 DIAGNOSIS — M9981 Other biomechanical lesions of cervical region: Secondary | ICD-10-CM | POA: Diagnosis not present

## 2013-03-09 DIAGNOSIS — M9981 Other biomechanical lesions of cervical region: Secondary | ICD-10-CM | POA: Diagnosis not present

## 2013-03-09 DIAGNOSIS — M999 Biomechanical lesion, unspecified: Secondary | ICD-10-CM | POA: Diagnosis not present

## 2013-03-09 DIAGNOSIS — M531 Cervicobrachial syndrome: Secondary | ICD-10-CM | POA: Diagnosis not present

## 2013-03-09 DIAGNOSIS — IMO0002 Reserved for concepts with insufficient information to code with codable children: Secondary | ICD-10-CM | POA: Diagnosis not present

## 2013-03-09 DIAGNOSIS — M5430 Sciatica, unspecified side: Secondary | ICD-10-CM | POA: Diagnosis not present

## 2013-03-19 DIAGNOSIS — IMO0002 Reserved for concepts with insufficient information to code with codable children: Secondary | ICD-10-CM | POA: Diagnosis not present

## 2013-03-19 DIAGNOSIS — M9981 Other biomechanical lesions of cervical region: Secondary | ICD-10-CM | POA: Diagnosis not present

## 2013-03-19 DIAGNOSIS — M999 Biomechanical lesion, unspecified: Secondary | ICD-10-CM | POA: Diagnosis not present

## 2013-03-19 DIAGNOSIS — M531 Cervicobrachial syndrome: Secondary | ICD-10-CM | POA: Diagnosis not present

## 2013-03-19 DIAGNOSIS — M5430 Sciatica, unspecified side: Secondary | ICD-10-CM | POA: Diagnosis not present

## 2013-04-02 DIAGNOSIS — M5430 Sciatica, unspecified side: Secondary | ICD-10-CM | POA: Diagnosis not present

## 2013-04-02 DIAGNOSIS — M999 Biomechanical lesion, unspecified: Secondary | ICD-10-CM | POA: Diagnosis not present

## 2013-04-02 DIAGNOSIS — IMO0002 Reserved for concepts with insufficient information to code with codable children: Secondary | ICD-10-CM | POA: Diagnosis not present

## 2013-04-02 DIAGNOSIS — M9981 Other biomechanical lesions of cervical region: Secondary | ICD-10-CM | POA: Diagnosis not present

## 2013-04-02 DIAGNOSIS — M531 Cervicobrachial syndrome: Secondary | ICD-10-CM | POA: Diagnosis not present

## 2013-04-21 DIAGNOSIS — IMO0002 Reserved for concepts with insufficient information to code with codable children: Secondary | ICD-10-CM | POA: Diagnosis not present

## 2013-04-21 DIAGNOSIS — M999 Biomechanical lesion, unspecified: Secondary | ICD-10-CM | POA: Diagnosis not present

## 2013-04-21 DIAGNOSIS — M9981 Other biomechanical lesions of cervical region: Secondary | ICD-10-CM | POA: Diagnosis not present

## 2013-04-21 DIAGNOSIS — M5430 Sciatica, unspecified side: Secondary | ICD-10-CM | POA: Diagnosis not present

## 2013-04-21 DIAGNOSIS — M531 Cervicobrachial syndrome: Secondary | ICD-10-CM | POA: Diagnosis not present

## 2013-04-30 DIAGNOSIS — M9981 Other biomechanical lesions of cervical region: Secondary | ICD-10-CM | POA: Diagnosis not present

## 2013-04-30 DIAGNOSIS — M531 Cervicobrachial syndrome: Secondary | ICD-10-CM | POA: Diagnosis not present

## 2013-04-30 DIAGNOSIS — M999 Biomechanical lesion, unspecified: Secondary | ICD-10-CM | POA: Diagnosis not present

## 2013-04-30 DIAGNOSIS — IMO0002 Reserved for concepts with insufficient information to code with codable children: Secondary | ICD-10-CM | POA: Diagnosis not present

## 2013-04-30 DIAGNOSIS — M5430 Sciatica, unspecified side: Secondary | ICD-10-CM | POA: Diagnosis not present

## 2013-05-21 DIAGNOSIS — M9981 Other biomechanical lesions of cervical region: Secondary | ICD-10-CM | POA: Diagnosis not present

## 2013-05-21 DIAGNOSIS — M531 Cervicobrachial syndrome: Secondary | ICD-10-CM | POA: Diagnosis not present

## 2013-05-21 DIAGNOSIS — M5430 Sciatica, unspecified side: Secondary | ICD-10-CM | POA: Diagnosis not present

## 2013-05-21 DIAGNOSIS — M999 Biomechanical lesion, unspecified: Secondary | ICD-10-CM | POA: Diagnosis not present

## 2013-05-21 DIAGNOSIS — IMO0002 Reserved for concepts with insufficient information to code with codable children: Secondary | ICD-10-CM | POA: Diagnosis not present

## 2013-05-31 DIAGNOSIS — M5430 Sciatica, unspecified side: Secondary | ICD-10-CM | POA: Diagnosis not present

## 2013-05-31 DIAGNOSIS — IMO0002 Reserved for concepts with insufficient information to code with codable children: Secondary | ICD-10-CM | POA: Diagnosis not present

## 2013-05-31 DIAGNOSIS — M9981 Other biomechanical lesions of cervical region: Secondary | ICD-10-CM | POA: Diagnosis not present

## 2013-05-31 DIAGNOSIS — M999 Biomechanical lesion, unspecified: Secondary | ICD-10-CM | POA: Diagnosis not present

## 2013-05-31 DIAGNOSIS — M531 Cervicobrachial syndrome: Secondary | ICD-10-CM | POA: Diagnosis not present

## 2013-06-25 DIAGNOSIS — IMO0002 Reserved for concepts with insufficient information to code with codable children: Secondary | ICD-10-CM | POA: Diagnosis not present

## 2013-06-25 DIAGNOSIS — M531 Cervicobrachial syndrome: Secondary | ICD-10-CM | POA: Diagnosis not present

## 2013-06-25 DIAGNOSIS — M9981 Other biomechanical lesions of cervical region: Secondary | ICD-10-CM | POA: Diagnosis not present

## 2013-06-25 DIAGNOSIS — M999 Biomechanical lesion, unspecified: Secondary | ICD-10-CM | POA: Diagnosis not present

## 2013-06-25 DIAGNOSIS — M5430 Sciatica, unspecified side: Secondary | ICD-10-CM | POA: Diagnosis not present

## 2013-06-30 DIAGNOSIS — Z23 Encounter for immunization: Secondary | ICD-10-CM | POA: Diagnosis not present

## 2013-07-09 ENCOUNTER — Ambulatory Visit (INDEPENDENT_AMBULATORY_CARE_PROVIDER_SITE_OTHER): Payer: Medicare Other | Admitting: Internal Medicine

## 2013-07-09 ENCOUNTER — Encounter: Payer: Self-pay | Admitting: Internal Medicine

## 2013-07-09 VITALS — BP 168/82 | HR 59 | Temp 97.9°F | Resp 12 | Ht 67.5 in | Wt 168.0 lb

## 2013-07-09 DIAGNOSIS — I10 Essential (primary) hypertension: Secondary | ICD-10-CM | POA: Diagnosis not present

## 2013-07-09 DIAGNOSIS — E785 Hyperlipidemia, unspecified: Secondary | ICD-10-CM

## 2013-07-09 DIAGNOSIS — F3289 Other specified depressive episodes: Secondary | ICD-10-CM | POA: Diagnosis not present

## 2013-07-09 DIAGNOSIS — M792 Neuralgia and neuritis, unspecified: Secondary | ICD-10-CM

## 2013-07-09 DIAGNOSIS — N402 Nodular prostate without lower urinary tract symptoms: Secondary | ICD-10-CM

## 2013-07-09 DIAGNOSIS — G5690 Unspecified mononeuropathy of unspecified upper limb: Secondary | ICD-10-CM

## 2013-07-09 DIAGNOSIS — Z0000 Encounter for general adult medical examination without abnormal findings: Secondary | ICD-10-CM | POA: Diagnosis not present

## 2013-07-09 DIAGNOSIS — F329 Major depressive disorder, single episode, unspecified: Secondary | ICD-10-CM | POA: Diagnosis not present

## 2013-07-09 DIAGNOSIS — B351 Tinea unguium: Secondary | ICD-10-CM

## 2013-07-09 DIAGNOSIS — N400 Enlarged prostate without lower urinary tract symptoms: Secondary | ICD-10-CM

## 2013-07-09 DIAGNOSIS — R5381 Other malaise: Secondary | ICD-10-CM

## 2013-07-09 DIAGNOSIS — F32A Depression, unspecified: Secondary | ICD-10-CM

## 2013-07-09 DIAGNOSIS — R5383 Other fatigue: Secondary | ICD-10-CM

## 2013-07-09 DIAGNOSIS — Z125 Encounter for screening for malignant neoplasm of prostate: Secondary | ICD-10-CM

## 2013-07-09 DIAGNOSIS — Z79899 Other long term (current) drug therapy: Secondary | ICD-10-CM

## 2013-07-09 MED ORDER — TETANUS-DIPHTH-ACELL PERTUSSIS 5-2.5-18.5 LF-MCG/0.5 IM SUSP: 0.5000 mL | Freq: Once | INTRAMUSCULAR | Status: DC

## 2013-07-09 MED ORDER — AMLODIPINE BESYLATE 5 MG PO TABS: 5.0000 mg | ORAL_TABLET | Freq: Every day | ORAL | Status: DC

## 2013-07-09 MED ORDER — TRAMADOL HCL 50 MG PO TABS: 50.0000 mg | ORAL_TABLET | Freq: Three times a day (TID) | ORAL | Status: DC | PRN

## 2013-07-09 NOTE — Progress Notes (Signed)
Patient ID: James Logan, male   DOB: 05-Jul-1946, 67 y.o.   MRN: [id number]  The patient is here for annual Medicare wellness examination and management of other chronic and acute problems. He was last seen a year ago and  he has  Multiple complaints to voice today.    1) Osteoarthritis,   Relieved with air mattress and exercise.  Some Tailbone pain aggravated by long car rides.  Cchronic right shoulder pain,  worse with rest and certain sleeping positions including supine position. 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  Using daily meloxicam  Using it daily  Blood pressure is elevated.   2) elevated blood pressures he has been checking his blood pressure periodically at home and noting elevations. He's never had a high blood pressures until this year. He has a history of sleep apnea which has not been treated due to intolerance of masks. His last sleep study was over 3 years ago at [facility]. He denies early morning fatigue and hypersomnolence throughout the day.   3) chronically discolored toenail , 4th toe on right foot ,.  Toenail  has been black for at least the a year. He has never been treated for fungus.. Needs podiatry  Referral   4) occasional urinary hesitancy with incomplete emptying of bladder. This has been going on for about a year.     The risk factors are reflected in the social history. History   Social History  . Marital Status: Married    Spouse Name: N/A    Number of Children: N/A  . Years of Education: N/A   Occupational History  . Not on file.   Social History Main Topics  . Smoking status: Former Smoker    Quit date: 10/16/1961  . Smokeless tobacco: Never Used  . Alcohol Use: Yes  . Drug Use: No  . Sexual Activity: Not on file   Other  Topics Concern  . Not on file   Social History Narrative  . No narrative on file    The roster of all physicians providing medical care to patient - is listed in the Snapshot section of the chart.  Activities of daily living:  The patient is 100% independent in all ADLs: dressing, toileting, feeding as well as independent mobility  Home safety : The patient has smoke detectors in the home. They wear seatbelts.  There are no firearms at home. There is no violence in the home.   There is no risks for hepatitis, STDs or HIV. There is no   history of blood transfusion. They have no travel history to infectious disease endemic areas of the world.  The patient has seen their dentist in the last six month. They have seen their eye doctor in the last year. They admit to slight hearing difficulty with regard to whispered voices and some television programs.  They have deferred audiologic testing in the last year.  They do not  have excessive sun exposure. Discussed the need for sun protection: hats, long sleeves and use of sunscreen if there is significant sun exposure.   Diet: the importance of a healthy diet is discussed. They do have a healthy diet.  The benefits of regular aerobic exercise  were discussed. She walks 4 times per week ,  20 minutes.   Depression screen: there are no signs or vegative symptoms of depression- irritability, change in appetite, anhedonia, sadness/tearfullness.  Cognitive assessment: the patient manages all their financial and personal affairs and is actively engaged. They could relate day,date,year and events; recalled 2/3 objects at 3 minutes; performed clock-face test normally.  The following portions of the patient's history were reviewed and updated as appropriate: allergies, current medications, past family history, past medical history,  past surgical history, past social history  and problem list.  Visual acuity was not assessed per patient preference since she  has regular follow up with her ophthalmologist. Hearing and body mass index were assessed and reviewed.   During the course of the visit the patient was educated and counseled about appropriate screening and preventive services including : fall prevention , diabetes screening, nutrition counseling, colorectal cancer screening, and recommended immunizations.    Objective:   BP 168/82  Pulse 59  Temp(Src) 97.9 F (36.6 C) (Oral)  Resp 12  Ht 5' 7.5" (1.715 m)  Wt 168 lb (76.204 kg)  BMI 25.91 kg/m2  SpO2 99%  General Appearance:    Alert, cooperative, no distress, appears stated age  Head:    Normocephalic, without obvious abnormality, atraumatic  Eyes:    PERRL, conjunctiva/corneas clear, EOM's intact, fundi    benign, both eyes       Ears:    Normal TM's and external ear canals, both ears  Nose:   Nares normal, septum midline, mucosa normal, no drainage   or sinus tenderness  Throat:   Lips, mucosa, and tongue normal; teeth and gums normal  Neck:   Supple, symmetrical, trachea midline, no adenopathy;       thyroid:  No enlargement/tenderness/nodules; no carotid   bruit or JVD  Back:     Symmetric, no curvature, ROM normal, no CVA tenderness  Lungs:     Clear to auscultation bilaterally, respirations unlabored  Chest wall:    No tenderness or deformity  Heart:    Regular rate and rhythm, S1 and S2 normal, no murmur, rub   or gallop  Abdomen:     Soft, non-tender, bowel sounds active all four quadrants,    no masses, no organomegaly  Genitalia:    Normal male without lesion, discharge or tenderness  Rectal:    Normal tone, asymmetric  Prostate, enlargement noted on the right   Extremities:   Extremities normal, atraumatic, no cyanosis or edema  Pulses:   2+ and symmetric all extremities  Skin:   Skin color, texture, turgor normal, no rashes or lesions Toenail on fourth toe right foot is black from cuticle to distal fourth  Lymph nodes:   Cervical, supraclavicular, and axillary  nodes normal  Neurologic:   CNII-XII intact. Normal strength, sensation and reflexes      throughout   Assessment and Plan: Essential hypertension, benign With chronic use of nonsteroidal anti-inflammatories I recommended he stop the nonsteroidals and use tramadol as needed for pain.  Return in 2 weeks for blood pressure check.  Prostate enlargement  Noted on exam today with asymmetric enlargement  of the right side. Referral to [facility] for evaluation. PSA is pending.  Depression Currently asymptomatic with SSRI therapy. Refills given.  Routine general medical examination at a health care facility Annual male exam was done including testicular and prostate exam, which was abnormal. PSA is pending .  Colon ca screening was reviewed and updated.  Referral to Urology is pending .   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 MRI of shoulder was discussed but I think it would be more prudent to do an MRI of his cervical spine  .  Onychomycosis He has thickened toenails bilaterally and discoloration of his fourth toenail on the right side.  Referral to podiatry.   Updated Medication List Outpatient Encounter Prescriptions as of 07/09/2013  Medication Sig Dispense Refill  . Azelastine HCl 0.15 % SOLN 1-2 squirts each nostril twice daily  30 mL  11  . citalopram (CELEXA) 20 MG tablet Take 20 mg by mouth daily.        . meloxicam (MOBIC) 15 MG tablet Take 15 mg by mouth daily.        [name] omeprazole (PRILOSEC OTC) 20 MG tablet Take 1 tablet (20 mg total) by mouth 2 (two) times daily before a meal. Prior to meals  60 tablet  5  . amLODipine (NORVASC) 5 MG tablet Take 1 tablet (5 mg total) by mouth daily.  90 tablet  3  . B Complex-C-Folic Acid (MULTIVITAMIN, STRESS FORMULA) tablet Take 1 tablet by mouth daily.        . TDaP (BOOSTRIX) 5-2.5-18.5 LF-MCG/0.5 injection Inject 0.5 mLs into the muscle once.  0.5 mL  0  . traMADol  (ULTRAM) 50 MG tablet Take 1 tablet (50 mg total) by mouth every 8 (eight) hours as needed for pain.  60 tablet  1   No facility-administered encounter medications on file as of 07/09/2013.

## 2013-07-09 NOTE — Patient Instructions (Addendum)
You had your wellness exam, but we also discussed several problems:  1) Your blood pressure may be elevated by the meloxicam  Stop it for two weeks and have your blood pressure rechecked at the pharmacy  Or here.  A Normal bp is 130/80 or less.  If it is higher,  Start the medication   You can try tramadol I will give you tramadol ,  A pain reliever,  You take it every 6 hours  As needed for pain   2) Referral to [facility] to evaluate abnormal prostate  3) MRI of cervical spine to be scheduled    4) Podiatry referral to be scheduled   Return for fasting blood work including PSA  TDaP recommended at some point this year or next

## 2013-07-11 DIAGNOSIS — I10 Essential (primary) hypertension: Secondary | ICD-10-CM | POA: Insufficient documentation

## 2013-07-11 DIAGNOSIS — B351 Tinea unguium: Secondary | ICD-10-CM | POA: Insufficient documentation

## 2013-07-11 DIAGNOSIS — Z0000 Encounter for general adult medical examination without abnormal findings: Secondary | ICD-10-CM | POA: Insufficient documentation

## 2013-07-11 NOTE — Assessment & Plan Note (Signed)
His exam and history is consistent with radicular pain from cervical stenosis. MRI of shoulder was discussed but I think it would be more prudent to do an MRI of his cervical spine  .

## 2013-07-11 NOTE — Assessment & Plan Note (Signed)
He has thickened toenails bilaterally and discoloration of his fourth toenail on the right side.  Referral to podiatry.

## 2013-07-11 NOTE — Assessment & Plan Note (Signed)
With chronic use of nonsteroidal anti-inflammatories I recommended he stop the nonsteroidals and use tramadol as needed for pain.  Return in 2 weeks for blood pressure check.

## 2013-07-11 NOTE — Assessment & Plan Note (Signed)
Noted on exam today with asymmetric enlargement  of the right side. Referral to [facility] for evaluation. PSA is pending.

## 2013-07-11 NOTE — Assessment & Plan Note (Addendum)
Annual male exam was done including testicular and prostate exam, which was abnormal. PSA is pending .  Colon ca screening was reviewed and updated.  Referral to Urology is pending .

## 2013-07-11 NOTE — Assessment & Plan Note (Signed)
Currently asymptomatic with SSRI therapy. Refills given.   

## 2013-07-12 ENCOUNTER — Other Ambulatory Visit (INDEPENDENT_AMBULATORY_CARE_PROVIDER_SITE_OTHER): Payer: Medicare Other

## 2013-07-12 DIAGNOSIS — Z125 Encounter for screening for malignant neoplasm of prostate: Secondary | ICD-10-CM | POA: Diagnosis not present

## 2013-07-12 DIAGNOSIS — Z79899 Other long term (current) drug therapy: Secondary | ICD-10-CM | POA: Diagnosis not present

## 2013-07-12 DIAGNOSIS — I10 Essential (primary) hypertension: Secondary | ICD-10-CM

## 2013-07-12 DIAGNOSIS — E875 Hyperkalemia: Secondary | ICD-10-CM

## 2013-07-12 DIAGNOSIS — R5381 Other malaise: Secondary | ICD-10-CM

## 2013-07-12 DIAGNOSIS — R5383 Other fatigue: Secondary | ICD-10-CM

## 2013-07-12 LAB — CBC WITH DIFFERENTIAL/PLATELET
Basophils Relative: 0.8 % (ref 0.0–3.0)
Eosinophils Relative: 2.2 % (ref 0.0–5.0)
Hemoglobin: 16.1 g/dL (ref 13.0–17.0)
Lymphocytes Relative: 43.2 % (ref 12.0–46.0)
MCHC: 34.1 g/dL (ref 30.0–36.0)
Neutro Abs: 3.6 10*3/uL (ref 1.4–7.7)
Neutrophils Relative %: 44.1 % (ref 43.0–77.0)
RBC: 4.81 Mil/uL (ref 4.22–5.81)
WBC: 8.3 10*3/uL (ref 4.5–10.5)

## 2013-07-12 LAB — PSA, MEDICARE: PSA: 1.16 ng/ml (ref 0.10–4.00)

## 2013-07-12 LAB — COMPREHENSIVE METABOLIC PANEL
Albumin: 4.1 g/dL (ref 3.5–5.2)
BUN: 24 mg/dL — ABNORMAL HIGH (ref 6–23)
CO2: 27 mEq/L (ref 19–32)
Calcium: 9.4 mg/dL (ref 8.4–10.5)
Chloride: 106 mEq/L (ref 96–112)
Creatinine, Ser: 1 mg/dL (ref 0.4–1.5)
GFR: 83.87 mL/min (ref >60.00)
Glucose, Bld: 94 mg/dL (ref 70–99)
Potassium: 5.4 mEq/L — ABNORMAL HIGH (ref 3.5–5.1)

## 2013-07-12 LAB — LDL CHOLESTEROL, DIRECT: Direct LDL: 146.8 mg/dL

## 2013-07-12 LAB — LIPID PANEL
Cholesterol: 208 mg/dL — ABNORMAL HIGH (ref 0–200)
HDL: 39 mg/dL — ABNORMAL LOW (ref >39.00)
Triglycerides: 155 mg/dL — ABNORMAL HIGH (ref 0.0–149.0)

## 2013-07-13 NOTE — Addendum Note (Signed)
Addended by: Sherlene Shams on: 07/13/2013 07:04 AM   Modules accepted: Orders

## 2013-07-15 ENCOUNTER — Telehealth: Payer: Self-pay | Admitting: Internal Medicine

## 2013-07-15 NOTE — Telephone Encounter (Signed)
Pt states he spoke with you and needs an appt for 2 weeks for lab work and nurse visit for BP check.  Pt spouse has appt 9/18, which is only 1 week out.  Pt would like to be scheduled this day if possible, rather than 2 weeks.  Lab appt scheduled 9/18 at 9:45 a.m.  Pt asking for call to confirm this is okay or if he needs to wait until the following week.  Pt would like you to leave msg if he is not available, but does want confirmation that the appt is okay or if needs to be rescheduled.

## 2013-07-16 DIAGNOSIS — IMO0002 Reserved for concepts with insufficient information to code with codable children: Secondary | ICD-10-CM | POA: Diagnosis not present

## 2013-07-16 DIAGNOSIS — M999 Biomechanical lesion, unspecified: Secondary | ICD-10-CM | POA: Diagnosis not present

## 2013-07-16 DIAGNOSIS — M5430 Sciatica, unspecified side: Secondary | ICD-10-CM | POA: Diagnosis not present

## 2013-07-16 DIAGNOSIS — M531 Cervicobrachial syndrome: Secondary | ICD-10-CM | POA: Diagnosis not present

## 2013-07-16 DIAGNOSIS — M9981 Other biomechanical lesions of cervical region: Secondary | ICD-10-CM | POA: Diagnosis not present

## 2013-07-16 NOTE — Telephone Encounter (Signed)
Patient notified

## 2013-07-20 ENCOUNTER — Telehealth: Payer: Self-pay | Admitting: Internal Medicine

## 2013-07-20 ENCOUNTER — Ambulatory Visit: Payer: Self-pay | Admitting: Internal Medicine

## 2013-07-20 DIAGNOSIS — Z87821 Personal history of retained foreign body fully removed: Secondary | ICD-10-CM | POA: Diagnosis not present

## 2013-07-20 DIAGNOSIS — M7750 Other enthesopathy of unspecified foot: Secondary | ICD-10-CM | POA: Diagnosis not present

## 2013-07-20 DIAGNOSIS — M4802 Spinal stenosis, cervical region: Secondary | ICD-10-CM | POA: Diagnosis not present

## 2013-07-20 DIAGNOSIS — M792 Neuralgia and neuritis, unspecified: Secondary | ICD-10-CM

## 2013-07-20 DIAGNOSIS — M47812 Spondylosis without myelopathy or radiculopathy, cervical region: Secondary | ICD-10-CM | POA: Diagnosis not present

## 2013-07-20 DIAGNOSIS — Q7649 Other congenital malformations of spine, not associated with scoliosis: Secondary | ICD-10-CM | POA: Diagnosis not present

## 2013-07-20 DIAGNOSIS — Z1389 Encounter for screening for other disorder: Secondary | ICD-10-CM | POA: Diagnosis not present

## 2013-07-20 DIAGNOSIS — M25519 Pain in unspecified shoulder: Secondary | ICD-10-CM | POA: Diagnosis not present

## 2013-07-20 NOTE — Assessment & Plan Note (Addendum)
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 This may be causing her persistent  neck and shoulder pain.  I am recommending neurosurgical evaluation

## 2013-07-20 NOTE — Telephone Encounter (Signed)
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 This may be causing her persistent  neck and shoulder pain.  I am recommending neurosurgical evaluation  

## 2013-07-21 ENCOUNTER — Encounter: Payer: Self-pay | Admitting: *Deleted

## 2013-07-21 NOTE — Telephone Encounter (Signed)
Patient stated he will discuss with his wife and let me know tomorrow about neurosurgeon evaluation while here for BP check. FYI

## 2013-07-22 ENCOUNTER — Other Ambulatory Visit (INDEPENDENT_AMBULATORY_CARE_PROVIDER_SITE_OTHER): Payer: Medicare Other

## 2013-07-22 ENCOUNTER — Ambulatory Visit (INDEPENDENT_AMBULATORY_CARE_PROVIDER_SITE_OTHER): Payer: Medicare Other | Admitting: *Deleted

## 2013-07-22 ENCOUNTER — Encounter: Payer: Self-pay | Admitting: *Deleted

## 2013-07-22 VITALS — BP 150/84

## 2013-07-22 DIAGNOSIS — I10 Essential (primary) hypertension: Secondary | ICD-10-CM | POA: Diagnosis not present

## 2013-07-22 DIAGNOSIS — E875 Hyperkalemia: Secondary | ICD-10-CM | POA: Diagnosis not present

## 2013-07-22 LAB — BASIC METABOLIC PANEL
CO2: 26 mEq/L (ref 19–32)
Calcium: 9.2 mg/dL (ref 8.4–10.5)
GFR: 84.89 mL/min (ref >60.00)
Sodium: 136 mEq/L (ref 135–145)

## 2013-07-27 ENCOUNTER — Telehealth: Payer: Self-pay | Admitting: Internal Medicine

## 2013-07-27 DIAGNOSIS — M792 Neuralgia and neuritis, unspecified: Secondary | ICD-10-CM

## 2013-07-27 NOTE — Telephone Encounter (Signed)
Referral is in process as requested 

## 2013-07-27 NOTE — Telephone Encounter (Signed)
After talking with wife patient has decided to have referral to neurosurgeon.

## 2013-07-27 NOTE — Telephone Encounter (Signed)
The patient is wanting a referral to a neurologist for what showed up in the scan.

## 2013-07-30 DIAGNOSIS — M5430 Sciatica, unspecified side: Secondary | ICD-10-CM | POA: Diagnosis not present

## 2013-07-30 DIAGNOSIS — M531 Cervicobrachial syndrome: Secondary | ICD-10-CM | POA: Diagnosis not present

## 2013-07-30 DIAGNOSIS — IMO0002 Reserved for concepts with insufficient information to code with codable children: Secondary | ICD-10-CM | POA: Diagnosis not present

## 2013-07-30 DIAGNOSIS — M9981 Other biomechanical lesions of cervical region: Secondary | ICD-10-CM | POA: Diagnosis not present

## 2013-07-30 DIAGNOSIS — M999 Biomechanical lesion, unspecified: Secondary | ICD-10-CM | POA: Diagnosis not present

## 2013-08-04 DIAGNOSIS — R3989 Other symptoms and signs involving the genitourinary system: Secondary | ICD-10-CM | POA: Diagnosis not present

## 2013-08-04 DIAGNOSIS — N403 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: Secondary | ICD-10-CM | POA: Insufficient documentation

## 2013-08-04 DIAGNOSIS — N138 Other obstructive and reflux uropathy: Secondary | ICD-10-CM | POA: Diagnosis not present

## 2013-08-04 DIAGNOSIS — Z87442 Personal history of urinary calculi: Secondary | ICD-10-CM | POA: Insufficient documentation

## 2013-08-05 ENCOUNTER — Encounter: Payer: Self-pay | Admitting: *Deleted

## 2013-08-06 ENCOUNTER — Encounter: Payer: Self-pay | Admitting: Internal Medicine

## 2013-08-09 DIAGNOSIS — IMO0002 Reserved for concepts with insufficient information to code with codable children: Secondary | ICD-10-CM | POA: Diagnosis not present

## 2013-08-09 DIAGNOSIS — M9981 Other biomechanical lesions of cervical region: Secondary | ICD-10-CM | POA: Diagnosis not present

## 2013-08-09 DIAGNOSIS — M999 Biomechanical lesion, unspecified: Secondary | ICD-10-CM | POA: Diagnosis not present

## 2013-08-09 DIAGNOSIS — M531 Cervicobrachial syndrome: Secondary | ICD-10-CM | POA: Diagnosis not present

## 2013-08-09 DIAGNOSIS — M5430 Sciatica, unspecified side: Secondary | ICD-10-CM | POA: Diagnosis not present

## 2013-08-13 DIAGNOSIS — M4712 Other spondylosis with myelopathy, cervical region: Secondary | ICD-10-CM | POA: Diagnosis not present

## 2013-08-20 ENCOUNTER — Encounter: Payer: Medicare Other | Admitting: *Deleted

## 2013-08-23 DIAGNOSIS — H2510 Age-related nuclear cataract, unspecified eye: Secondary | ICD-10-CM | POA: Diagnosis not present

## 2013-08-26 ENCOUNTER — Other Ambulatory Visit: Payer: Self-pay | Admitting: Neurosurgery

## 2013-08-26 DIAGNOSIS — M4712 Other spondylosis with myelopathy, cervical region: Secondary | ICD-10-CM | POA: Diagnosis not present

## 2013-09-03 DIAGNOSIS — M9981 Other biomechanical lesions of cervical region: Secondary | ICD-10-CM | POA: Diagnosis not present

## 2013-09-03 DIAGNOSIS — M999 Biomechanical lesion, unspecified: Secondary | ICD-10-CM | POA: Diagnosis not present

## 2013-09-03 DIAGNOSIS — M531 Cervicobrachial syndrome: Secondary | ICD-10-CM | POA: Diagnosis not present

## 2013-09-03 DIAGNOSIS — IMO0002 Reserved for concepts with insufficient information to code with codable children: Secondary | ICD-10-CM | POA: Diagnosis not present

## 2013-09-03 DIAGNOSIS — M5430 Sciatica, unspecified side: Secondary | ICD-10-CM | POA: Diagnosis not present

## 2013-09-06 ENCOUNTER — Encounter (HOSPITAL_COMMUNITY): Payer: Self-pay | Admitting: Pharmacy Technician

## 2013-09-07 ENCOUNTER — Encounter (HOSPITAL_COMMUNITY): Payer: Self-pay

## 2013-09-07 ENCOUNTER — Encounter (HOSPITAL_COMMUNITY)
Admission: RE | Admit: 2013-09-07 | Discharge: 2013-09-07 | Disposition: A | Discharge status: Home / Self Care | Payer: Medicare Other | Source: Ambulatory Visit | Attending: Neurosurgery | Admitting: Neurosurgery

## 2013-09-07 DIAGNOSIS — Z01818 Encounter for other preprocedural examination: Secondary | ICD-10-CM | POA: Diagnosis not present

## 2013-09-07 DIAGNOSIS — Z01812 Encounter for preprocedural laboratory examination: Secondary | ICD-10-CM | POA: Diagnosis not present

## 2013-09-07 DIAGNOSIS — J45909 Unspecified asthma, uncomplicated: Secondary | ICD-10-CM | POA: Diagnosis not present

## 2013-09-07 DIAGNOSIS — Z01810 Encounter for preprocedural cardiovascular examination: Secondary | ICD-10-CM | POA: Insufficient documentation

## 2013-09-07 HISTORY — DX: Pneumonia, unspecified organism: J18.9

## 2013-09-07 HISTORY — DX: Sleep apnea, unspecified: G47.30

## 2013-09-07 HISTORY — DX: Unspecified asthma, uncomplicated: J45.909

## 2013-09-07 HISTORY — DX: Essential (primary) hypertension: I10

## 2013-09-07 LAB — BASIC METABOLIC PANEL
CO2: 25 mEq/L (ref 19–32)
Calcium: 9.5 mg/dL (ref 8.4–10.5)
Chloride: 102 mEq/L (ref 96–112)
GFR calc Af Amer: >90 mL/min (ref >90)
Glucose, Bld: 98 mg/dL (ref 70–99)
Potassium: 4.3 mEq/L (ref 3.5–5.1)
Sodium: 137 mEq/L (ref 135–145)

## 2013-09-07 LAB — CBC WITH DIFFERENTIAL/PLATELET
Basophils Relative: 1 % (ref 0–1)
Eosinophils Absolute: 0.2 10*3/uL (ref 0.0–0.7)
HCT: 46.4 % (ref 39.0–52.0)
Hemoglobin: 17.3 g/dL — ABNORMAL HIGH (ref 13.0–17.0)
Lymphocytes Relative: 42 % (ref 12–46)
Lymphs Abs: 4.6 10*3/uL — ABNORMAL HIGH (ref 0.7–4.0)
MCH: 34.7 pg — ABNORMAL HIGH (ref 26.0–34.0)
MCHC: 37.3 g/dL — ABNORMAL HIGH (ref 30.0–36.0)
Monocytes Absolute: 1.1 10*3/uL — ABNORMAL HIGH (ref 0.1–1.0)
Monocytes Relative: 10 % (ref 3–12)

## 2013-09-07 LAB — SURGICAL PCR SCREEN: MRSA, PCR: NEGATIVE

## 2013-09-07 NOTE — Pre-Procedure Instructions (Signed)
James Logan  09/07/2013   Your procedure is scheduled on:  Friday September 14, 2013 at 1014 AM  Report to [facility]"  at 0815 AM.  Call this number if you have problems the morning of surgery: [phone]   Remember:   Do not eat food or drink liquids after midnight.   Take these medicines the morning of surgery with A SIP OF WATER: Amlodipine (Norvasc), and Prilosec (Omeprazole)   Stop Meloxicam and Ibuprofen today 7 days prior to surgery.  Do not wear jewelry.  Do not wear lotions, powders, or perfumes. You may wear deodorant.               Men may shave face and neck.  Do not bring valuables to the hospital.  [facility] is not responsible  for any belongings or valuables.               Contacts, dentures or bridgework may not be worn into surgery.  Leave suitcase in the car. After surgery it may be brought to your room.  For patients admitted to the hospital, discharge time is determined by  treatment team.               Patients discharged the day of surgery will not be allowed to drive home.  Special Instructions: Shower using CHG 2 nights before surgery and the night before surgery.  If you shower the day of surgery use CHG.  Use special wash - you have one bottle of CHG for all showers.  You should use approximately 1/3 of the bottle for each shower.   Please read over the following fact sheets that you were given: Pain Booklet, Coughing and Deep Breathing, MRSA Information and Surgical Site Infection Prevention

## 2013-09-09 ENCOUNTER — Other Ambulatory Visit: Payer: Self-pay | Admitting: Internal Medicine

## 2013-09-09 DIAGNOSIS — R9431 Abnormal electrocardiogram [ECG] [EKG]: Secondary | ICD-10-CM

## 2013-09-09 DIAGNOSIS — Z01810 Encounter for preprocedural cardiovascular examination: Secondary | ICD-10-CM

## 2013-09-09 NOTE — Progress Notes (Addendum)
Anesthesia Chart Review:  Patient is a 67 year old male scheduled for C4-5, C5-6 ACDF on 09/17/13 by Dr. Jordan Likes. 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 PCP is Dr. Duncan Dull.    Preoperative CXR and labs noted.  EKG on 09/07/13 showed NSR, right BBB, T wave abnormality, consider inferior ischemia. Inferior abnormality is slightly more pronounced when compared to his 09/09/12 EKG at [facility].    I reviewed above with anesthesiologist Dr. Michelle Piper.  Would like to get additional input regarding potential need for cardiology evaluation.  I contacted Dr. Melina Schools office, but she is off this afternoon. I have sent her a staff message requesting her input.  She is suppose to be working tomorrow, so hoping I'll hear something soon.  I have updated Erie Noe at Dr. Lindalou Hose office.  Velna Ochs [facility] Short Stay Center/Anesthesiology Phone [phone] 09/09/2013 3:47 PM  Addendum: 09/13/2013 5:00 PM Patient was evaluated by cardiologist Dr. Mariah Milling earlier today. Because of his high exercise tolerance and no significant CAD risk factors, he was felt to be acceptable risk for surgery.  He did increase amlodipine for better perioperative BP control.

## 2013-09-10 ENCOUNTER — Telehealth: Payer: Self-pay | Admitting: Internal Medicine

## 2013-09-10 NOTE — Telephone Encounter (Signed)
Patient is aware, he was not happy. He will attend the patient with Dr. Mariah Milling on Monday 11/10.

## 2013-09-10 NOTE — Telephone Encounter (Signed)
Patients preoperative ekg was abnormal,  And Dr Dutch Quint and I were concerned about his risk for having a perioperative heart attack so we are having him see a cardiologist first.

## 2013-09-13 ENCOUNTER — Encounter: Payer: Self-pay | Admitting: Cardiovascular Disease

## 2013-09-13 ENCOUNTER — Ambulatory Visit (INDEPENDENT_AMBULATORY_CARE_PROVIDER_SITE_OTHER): Payer: Medicare Other | Admitting: Cardiovascular Disease

## 2013-09-13 VITALS — BP 142/86 | HR 73 | Ht 68.0 in | Wt 169.5 lb

## 2013-09-13 DIAGNOSIS — Z01810 Encounter for preprocedural cardiovascular examination: Secondary | ICD-10-CM

## 2013-09-13 DIAGNOSIS — R9431 Abnormal electrocardiogram [ECG] [EKG]: Secondary | ICD-10-CM | POA: Diagnosis not present

## 2013-09-13 DIAGNOSIS — I482 Chronic atrial fibrillation, unspecified: Secondary | ICD-10-CM | POA: Insufficient documentation

## 2013-09-13 DIAGNOSIS — I10 Essential (primary) hypertension: Secondary | ICD-10-CM

## 2013-09-13 NOTE — Assessment & Plan Note (Addendum)
We have gone through all of his available EKGs dating back to 2013. He has had nonspecific ST abnormality seen in 2013 as well as recently. Given his high exercise tolerance, no significant risk factors of CAD, he would be acceptable risk for upcoming surgery this Friday. No further testing needed prior to the surgery. We have suggested he increase his amlodipine up to 5 mg twice a day or 10 mg for better blood pressure control in the pre-and perioperative period.

## 2013-09-13 NOTE — Progress Notes (Signed)
Patient ID: James Logan, male    DOB: [birth year], 67 y.o.   MRN: [id number]  HPI Comments: James Logan is a pleasant 67 year old gentleman with history of hypertension, cervical disc disease, history of surgery on his neck with fusion, fall in 1993 with traumatic injury requiring splenectomy, subsequent ventral hernia requiring repair in January 2014 with mesh placement, remote history of smoking for 5 years who stopped  at age 3, presenting for preoperative evaluation and abnormal EKG . He reports that he is scheduled to have C4-C5 surgery in 5 days time by Dr. Dutch Quint in [place] .  As part of his evaluation, preop, he was found to have nonspecific EKG changes . This was a surprise to him. He has never had cardiac issues in the past. He is very active at baseline, reports working for 6 hours this past weekend using a leaf blower with a pack  on his back . Several weeks ago, regularly he used an elliptical for 30 minutes every morning. With his activities, he denies any shortness of breath or chest discomfort. No lightheadedness or dizziness. He reports having good exercise tolerance. He is a "neat freak" like to keep his house and garden organized and clean. This requires him to work religiously to maintain his property.  He does report having some discomfort in the right upper shoulder, right posterior shoulder, sometimes down his right arm  Prior MRI of the back and neck 07/20/2013 showing moderate spinal canal stenosis at C3-C4, C4-C5  Prior EKGs dated 09/09/2012 showing normal sinus rhythm with rate 58 beats per minute, right bundle branch block, nonspecific ST abnormality in inferior leads EKG 09/07/2013 showing normal sinus rhythm with rate 72 beats per minute, right bundle branch block, nonspecific ST of normality in inferior leads, anterolateral leads EKG today showing normal sinus rhythm with rate 73 beats a minute, nonspecific ST abnormality in anterolateral leads, V5 and  V6     Outpatient Encounter Prescriptions as of 09/13/2013  Medication Sig  . amLODipine (NORVASC) 5 MG tablet Take 1 tablet (5 mg total) by mouth daily.  [name] azelastine (ASTELIN) 137 MCG/SPRAY nasal spray Place 1 spray into the nose daily as needed for rhinitis. Use in each nostril as directed  . ibuprofen (ADVIL,MOTRIN) 200 MG tablet Take 400 mg by mouth every 6 (six) hours as needed for pain.  [name] Ketotifen Fumarate (ALAWAY OP) Place 1 drop into both eyes daily as needed.  . meloxicam (MOBIC) 15 MG tablet Take 15 mg by mouth daily as needed for pain.   [name] omeprazole (PRILOSEC) 20 MG capsule Take 20 mg by mouth as needed.     Review of Systems  Constitutional: Negative.   HENT: Negative.   Eyes: Negative.   Respiratory: Negative.   Cardiovascular: Negative.   Gastrointestinal: Negative.   Endocrine: Negative.   Musculoskeletal: Positive for neck pain.  Skin: Negative.   Allergic/Immunologic: Negative.   Neurological: Negative.   Hematological: Negative.   Psychiatric/Behavioral: Negative.   All other systems reviewed and are negative.    BP 142/86  Pulse 73  Ht 5\' 8"  (1.727 m)  Wt 169 lb 8 oz (76.885 kg)  BMI 25.78 kg/m2  Physical Exam  Nursing note and vitals reviewed. Constitutional: He is oriented to person, place, and time. He appears well-developed and well-nourished.  HENT:  Head: Normocephalic.  Nose: Nose normal.  Mouth/Throat: Oropharynx is clear and moist.  Eyes: Conjunctivae are normal. Pupils are equal, round, and reactive to light.  Neck: Normal range  of motion. Neck supple. No JVD present.  Cardiovascular: Normal rate, regular rhythm, S1 normal, S2 normal, normal heart sounds and intact distal pulses.  Exam reveals no gallop and no friction rub.   No murmur heard. Pulmonary/Chest: Effort normal and breath sounds normal. No respiratory distress. He has no wheezes. He has no rales. He exhibits no tenderness.  Abdominal: Soft. Bowel sounds are normal. He  exhibits no distension. There is no tenderness.  Musculoskeletal: Normal range of motion. He exhibits no edema and no tenderness.  Lymphadenopathy:    He has no cervical adenopathy.  Neurological: He is alert and oriented to person, place, and time. Coordination normal.  Skin: Skin is warm and dry. No rash noted. No erythema.  Psychiatric: He has a normal mood and affect. His behavior is normal. Judgment and thought content normal.      Assessment and Plan

## 2013-09-13 NOTE — Patient Instructions (Addendum)
You are doing well.  You can proceed with surgery this friday  Please take the amlodipine up to 5 in the evening  Take amloidpine 5 in the AM if needed for pressure >140  Please call us if you have new issues that need to be addressed before your next appt.  Your physician wants you to follow-up in: 6 months.  You will receive a reminder letter in the mail two months in advance. If you don't receive a letter, please call our office to schedule the follow-up appointment.

## 2013-09-13 NOTE — Assessment & Plan Note (Addendum)
Nonspecific ST abnormality seen dating back to 2013. Given his lack of symptoms, we will monitor him for now. He would be acceptable risk for upcoming neck surgery. We have suggested if he has any shortness of breath or chest pain symptoms with exertion (such as when he exercises on his elliptical), that he contact our office for further evaluation. Currently he has a good exercise tolerance.

## 2013-09-13 NOTE — Assessment & Plan Note (Signed)
We have suggested he closely monitor his blood pressure at home. I suspect he will need amlodipine 5 mg twice a day for 10 mg daily. He has followup with Dr. Darrick Huntsman in the next week or so.

## 2013-09-16 MED ORDER — CEFAZOLIN SODIUM-DEXTROSE 2-3 GM-% IV SOLR
2.0000 g | INTRAVENOUS | Status: AC
  Administered 2013-09-17: 2 g via INTRAVENOUS
  Filled 2013-09-16: qty 50

## 2013-09-17 ENCOUNTER — Encounter (HOSPITAL_COMMUNITY): Payer: Medicare Other | Admitting: Vascular Surgery

## 2013-09-17 ENCOUNTER — Inpatient Hospital Stay (HOSPITAL_COMMUNITY): Payer: Medicare Other

## 2013-09-17 ENCOUNTER — Inpatient Hospital Stay (HOSPITAL_COMMUNITY)
Admission: RE | Admit: 2013-09-17 | Discharge: 2013-09-18 | DRG: 473 | Disposition: A | Discharge status: Home / Self Care | Payer: Medicare Other | Source: Ambulatory Visit | Attending: Neurosurgery | Admitting: Neurosurgery

## 2013-09-17 ENCOUNTER — Inpatient Hospital Stay (HOSPITAL_COMMUNITY): Payer: Medicare Other | Admitting: Anesthesiology

## 2013-09-17 ENCOUNTER — Encounter (HOSPITAL_COMMUNITY): Payer: Self-pay | Admitting: Anesthesiology

## 2013-09-17 ENCOUNTER — Encounter (HOSPITAL_COMMUNITY)
Admission: RE | Disposition: A | Discharge status: Home / Self Care | Payer: Self-pay | Source: Ambulatory Visit | Attending: Neurosurgery

## 2013-09-17 DIAGNOSIS — G4730 Sleep apnea, unspecified: Secondary | ICD-10-CM | POA: Diagnosis present

## 2013-09-17 DIAGNOSIS — M47812 Spondylosis without myelopathy or radiculopathy, cervical region: Secondary | ICD-10-CM | POA: Diagnosis not present

## 2013-09-17 DIAGNOSIS — I10 Essential (primary) hypertension: Secondary | ICD-10-CM | POA: Diagnosis not present

## 2013-09-17 DIAGNOSIS — M4802 Spinal stenosis, cervical region: Secondary | ICD-10-CM | POA: Diagnosis not present

## 2013-09-17 DIAGNOSIS — Z87442 Personal history of urinary calculi: Secondary | ICD-10-CM | POA: Diagnosis not present

## 2013-09-17 DIAGNOSIS — Z87891 Personal history of nicotine dependence: Secondary | ICD-10-CM | POA: Diagnosis not present

## 2013-09-17 DIAGNOSIS — F329 Major depressive disorder, single episode, unspecified: Secondary | ICD-10-CM | POA: Diagnosis not present

## 2013-09-17 DIAGNOSIS — Z8701 Personal history of pneumonia (recurrent): Secondary | ICD-10-CM

## 2013-09-17 DIAGNOSIS — Z9089 Acquired absence of other organs: Secondary | ICD-10-CM

## 2013-09-17 DIAGNOSIS — Z79899 Other long term (current) drug therapy: Secondary | ICD-10-CM | POA: Diagnosis not present

## 2013-09-17 DIAGNOSIS — F3289 Other specified depressive episodes: Secondary | ICD-10-CM | POA: Diagnosis present

## 2013-09-17 DIAGNOSIS — R29898 Other symptoms and signs involving the musculoskeletal system: Secondary | ICD-10-CM | POA: Diagnosis not present

## 2013-09-17 DIAGNOSIS — R490 Dysphonia: Secondary | ICD-10-CM | POA: Diagnosis not present

## 2013-09-17 DIAGNOSIS — M5090 Cervical disc disorder, unspecified, unspecified cervical region: Secondary | ICD-10-CM | POA: Diagnosis not present

## 2013-09-17 DIAGNOSIS — M4712 Other spondylosis with myelopathy, cervical region: Principal | ICD-10-CM | POA: Diagnosis present

## 2013-09-17 DIAGNOSIS — K219 Gastro-esophageal reflux disease without esophagitis: Secondary | ICD-10-CM | POA: Diagnosis present

## 2013-09-17 HISTORY — PX: ANTERIOR CERVICAL DECOMP/DISCECTOMY FUSION: SHX1161

## 2013-09-17 SURGERY — ANTERIOR CERVICAL DECOMPRESSION/DISCECTOMY FUSION 2 LEVELS
Anesthesia: General | Site: Spine Cervical | Wound class: Clean

## 2013-09-17 MED ORDER — LACTATED RINGERS IV SOLN
INTRAVENOUS | Status: DC | PRN
  Administered 2013-09-17 (×2): via INTRAVENOUS

## 2013-09-17 MED ORDER — HYDROMORPHONE HCL PF 1 MG/ML IJ SOLN
0.2500 mg | INTRAMUSCULAR | Status: DC | PRN
  Administered 2013-09-17 (×3): 0.5 mg via INTRAVENOUS

## 2013-09-17 MED ORDER — KETOTIFEN FUMARATE 0.025 % OP SOLN
1.0000 [drp] | Freq: Two times a day (BID) | OPHTHALMIC | Status: DC
  Administered 2013-09-17: 1 [drp] via OPHTHALMIC
  Filled 2013-09-17: qty 5

## 2013-09-17 MED ORDER — PANTOPRAZOLE SODIUM 40 MG PO TBEC: 80.0000 mg | DELAYED_RELEASE_TABLET | Freq: Every day | ORAL | Status: DC

## 2013-09-17 MED ORDER — ALUM & MAG HYDROXIDE-SIMETH 200-200-20 MG/5ML PO SUSP: 30.0000 mL | Freq: Four times a day (QID) | ORAL | Status: DC | PRN

## 2013-09-17 MED ORDER — SODIUM CHLORIDE 0.9 % IJ SOLN
3.0000 mL | Freq: Two times a day (BID) | INTRAMUSCULAR | Status: DC
  Administered 2013-09-17: 3 mL via INTRAVENOUS

## 2013-09-17 MED ORDER — AZELASTINE HCL 0.1 % NA SOLN
1.0000 | Freq: Every day | NASAL | Status: DC | PRN
  Filled 2013-09-17: qty 30

## 2013-09-17 MED ORDER — THROMBIN 5000 UNITS EX SOLR
CUTANEOUS | Status: DC | PRN
  Administered 2013-09-17 (×3): 5000 [IU] via TOPICAL

## 2013-09-17 MED ORDER — LIDOCAINE HCL (CARDIAC) 20 MG/ML IV SOLN
INTRAVENOUS | Status: DC | PRN
  Administered 2013-09-17: 40 mg via INTRAVENOUS

## 2013-09-17 MED ORDER — ROCURONIUM BROMIDE 100 MG/10ML IV SOLN
INTRAVENOUS | Status: DC | PRN
  Administered 2013-09-17: 50 mg via INTRAVENOUS
  Administered 2013-09-17: 10 mg via INTRAVENOUS

## 2013-09-17 MED ORDER — MIDAZOLAM HCL 5 MG/5ML IJ SOLN
INTRAMUSCULAR | Status: DC | PRN
  Administered 2013-09-17: 2 mg via INTRAVENOUS

## 2013-09-17 MED ORDER — NEOSTIGMINE METHYLSULFATE 1 MG/ML IJ SOLN
INTRAMUSCULAR | Status: DC | PRN
  Administered 2013-09-17: 3 mg via INTRAVENOUS

## 2013-09-17 MED ORDER — OXYCODONE HCL 5 MG PO TABS
ORAL_TABLET | ORAL | Status: AC
  Filled 2013-09-17: qty 1

## 2013-09-17 MED ORDER — HEMOSTATIC AGENTS (NO CHARGE) OPTIME
TOPICAL | Status: DC | PRN
  Administered 2013-09-17: 1 via TOPICAL

## 2013-09-17 MED ORDER — ACETAMINOPHEN 650 MG RE SUPP: 650.0000 mg | RECTAL | Status: DC | PRN

## 2013-09-17 MED ORDER — OXYCODONE HCL 5 MG PO TABS
5.0000 mg | ORAL_TABLET | Freq: Once | ORAL | Status: AC | PRN
  Administered 2013-09-17: 5 mg via ORAL

## 2013-09-17 MED ORDER — PHENYLEPHRINE HCL 10 MG/ML IJ SOLN
INTRAMUSCULAR | Status: DC | PRN
  Administered 2013-09-17: 40 ug via INTRAVENOUS
  Administered 2013-09-17 (×2): 80 ug via INTRAVENOUS

## 2013-09-17 MED ORDER — METOCLOPRAMIDE HCL 5 MG/ML IJ SOLN: 10.0000 mg | Freq: Once | INTRAMUSCULAR | Status: DC | PRN

## 2013-09-17 MED ORDER — MENTHOL 3 MG MT LOZG
1.0000 | LOZENGE | OROMUCOSAL | Status: DC | PRN
  Filled 2013-09-17: qty 9

## 2013-09-17 MED ORDER — DEXAMETHASONE SODIUM PHOSPHATE 10 MG/ML IJ SOLN
INTRAMUSCULAR | Status: AC
  Filled 2013-09-17: qty 1

## 2013-09-17 MED ORDER — GLYCOPYRROLATE 0.2 MG/ML IJ SOLN
INTRAMUSCULAR | Status: DC | PRN
  Administered 2013-09-17: 0.4 mg via INTRAVENOUS

## 2013-09-17 MED ORDER — SODIUM CHLORIDE 0.9 % IV SOLN: 250.0000 mL | INTRAVENOUS | Status: DC

## 2013-09-17 MED ORDER — OXYCODONE HCL 5 MG/5ML PO SOLN: 5.0000 mg | Freq: Once | ORAL | Status: AC | PRN

## 2013-09-17 MED ORDER — ACETAMINOPHEN 325 MG PO TABS: 650.0000 mg | ORAL_TABLET | ORAL | Status: DC | PRN

## 2013-09-17 MED ORDER — BACITRACIN 50000 UNITS IM SOLR
INTRAMUSCULAR | Status: DC | PRN
  Administered 2013-09-17: 12:00:00

## 2013-09-17 MED ORDER — DEXAMETHASONE SODIUM PHOSPHATE 10 MG/ML IJ SOLN
10.0000 mg | INTRAMUSCULAR | Status: AC
  Administered 2013-09-17: 10 mg via INTRAVENOUS

## 2013-09-17 MED ORDER — SENNA 8.6 MG PO TABS
1.0000 | ORAL_TABLET | Freq: Two times a day (BID) | ORAL | Status: DC
  Filled 2013-09-17 (×2): qty 1

## 2013-09-17 MED ORDER — CYCLOBENZAPRINE HCL 10 MG PO TABS
10.0000 mg | ORAL_TABLET | Freq: Three times a day (TID) | ORAL | Status: DC | PRN
  Administered 2013-09-17: 10 mg via ORAL

## 2013-09-17 MED ORDER — ONDANSETRON HCL 4 MG/2ML IJ SOLN: 4.0000 mg | INTRAMUSCULAR | Status: DC | PRN

## 2013-09-17 MED ORDER — PHENOL 1.4 % MT LIQD: 1.0000 | OROMUCOSAL | Status: DC | PRN

## 2013-09-17 MED ORDER — PROPOFOL 10 MG/ML IV BOLUS
INTRAVENOUS | Status: DC | PRN
  Administered 2013-09-17: 180 mg via INTRAVENOUS

## 2013-09-17 MED ORDER — CEFAZOLIN SODIUM 1-5 GM-% IV SOLN
1.0000 g | Freq: Three times a day (TID) | INTRAVENOUS | Status: AC
  Administered 2013-09-17 – 2013-09-18 (×2): 1 g via INTRAVENOUS
  Filled 2013-09-17 (×2): qty 50

## 2013-09-17 MED ORDER — ONDANSETRON HCL 4 MG/2ML IJ SOLN
INTRAMUSCULAR | Status: DC | PRN
  Administered 2013-09-17: 4 mg via INTRAVENOUS

## 2013-09-17 MED ORDER — AMLODIPINE BESYLATE 5 MG PO TABS
5.0000 mg | ORAL_TABLET | Freq: Every day | ORAL | Status: DC
  Filled 2013-09-17: qty 1

## 2013-09-17 MED ORDER — FENTANYL CITRATE 0.05 MG/ML IJ SOLN
INTRAMUSCULAR | Status: DC | PRN
  Administered 2013-09-17 (×4): 50 ug via INTRAVENOUS
  Administered 2013-09-17: 100 ug via INTRAVENOUS

## 2013-09-17 MED ORDER — CYCLOBENZAPRINE HCL 10 MG PO TABS
ORAL_TABLET | ORAL | Status: AC
  Filled 2013-09-17: qty 1

## 2013-09-17 MED ORDER — HYDROMORPHONE HCL PF 1 MG/ML IJ SOLN: 0.5000 mg | INTRAMUSCULAR | Status: DC | PRN

## 2013-09-17 MED ORDER — 0.9 % SODIUM CHLORIDE (POUR BTL) OPTIME
TOPICAL | Status: DC | PRN
  Administered 2013-09-17: 1000 mL

## 2013-09-17 MED ORDER — HYDROMORPHONE HCL PF 1 MG/ML IJ SOLN
INTRAMUSCULAR | Status: AC
  Filled 2013-09-17: qty 1

## 2013-09-17 MED ORDER — SODIUM CHLORIDE 0.9 % IJ SOLN: 3.0000 mL | INTRAMUSCULAR | Status: DC | PRN

## 2013-09-17 MED ORDER — HYDROCODONE-ACETAMINOPHEN 5-325 MG PO TABS: 1.0000 | ORAL_TABLET | ORAL | Status: DC | PRN

## 2013-09-17 MED ORDER — LACTATED RINGERS IV SOLN
INTRAVENOUS | Status: DC
  Administered 2013-09-17: 08:00:00 via INTRAVENOUS

## 2013-09-17 MED ORDER — OXYCODONE-ACETAMINOPHEN 5-325 MG PO TABS
1.0000 | ORAL_TABLET | ORAL | Status: DC | PRN
  Administered 2013-09-17: 2 via ORAL
  Filled 2013-09-17: qty 2

## 2013-09-17 SURGICAL SUPPLY — 54 items
BAG DECANTER FOR FLEXI CONT (MISCELLANEOUS) ×2 IMPLANT
BENZOIN TINCTURE PRP APPL 2/3 (GAUZE/BANDAGES/DRESSINGS) ×2 IMPLANT
BRUSH SCRUB EZ PLAIN DRY (MISCELLANEOUS) ×2 IMPLANT
BUR MATCHSTICK NEURO 3.0 LAGG (BURR) ×2 IMPLANT
CANISTER SUCT 3000ML (MISCELLANEOUS) ×2 IMPLANT
CONT SPEC 4OZ CLIKSEAL STRL BL (MISCELLANEOUS) ×2 IMPLANT
DRAPE C-ARM 42X72 X-RAY (DRAPES) ×4 IMPLANT
DRAPE LAPAROTOMY 100X72 PEDS (DRAPES) ×2 IMPLANT
DRAPE MICROSCOPE ZEISS OPMI (DRAPES) ×2 IMPLANT
DRAPE POUCH INSTRU U-SHP 10X18 (DRAPES) ×2 IMPLANT
DURAPREP 6ML APPLICATOR 50/CS (WOUND CARE) ×2 IMPLANT
ELECT COATED BLADE 2.86 ST (ELECTRODE) ×2 IMPLANT
ELECT REM PT RETURN 9FT ADLT (ELECTROSURGICAL) ×2
ELECTRODE REM PT RTRN 9FT ADLT (ELECTROSURGICAL) ×1 IMPLANT
GAUZE SPONGE 4X4 16PLY XRAY LF (GAUZE/BANDAGES/DRESSINGS) IMPLANT
GLOVE BIOGEL PI IND STRL 7.0 (GLOVE) ×3 IMPLANT
GLOVE BIOGEL PI INDICATOR 7.0 (GLOVE) ×3
GLOVE ECLIPSE 8.5 STRL (GLOVE) ×2 IMPLANT
GLOVE EXAM NITRILE LRG STRL (GLOVE) IMPLANT
GLOVE EXAM NITRILE MD LF STRL (GLOVE) IMPLANT
GLOVE EXAM NITRILE XL STR (GLOVE) IMPLANT
GLOVE EXAM NITRILE XS STR PU (GLOVE) IMPLANT
GLOVE SS BIOGEL STRL SZ 6.5 (GLOVE) ×1 IMPLANT
GLOVE SUPERSENSE BIOGEL SZ 6.5 (GLOVE) ×1
GOWN BRE IMP SLV AUR LG STRL (GOWN DISPOSABLE) ×2 IMPLANT
GOWN BRE IMP SLV AUR XL STRL (GOWN DISPOSABLE) ×2 IMPLANT
GOWN STRL REIN 2XL LVL4 (GOWN DISPOSABLE) IMPLANT
HEAD HALTER (SOFTGOODS) ×2 IMPLANT
HEMOSTAT POWDER KIT SURGIFOAM (HEMOSTASIS) ×2 IMPLANT
HEMOSTAT SURGICEL 2X14 (HEMOSTASIS) IMPLANT
KIT BASIN OR (CUSTOM PROCEDURE TRAY) ×2 IMPLANT
KIT ROOM TURNOVER OR (KITS) ×2 IMPLANT
NEEDLE SPNL 20GX3.5 QUINCKE YW (NEEDLE) ×2 IMPLANT
NS IRRIG 1000ML POUR BTL (IV SOLUTION) ×2 IMPLANT
PACK LAMINECTOMY NEURO (CUSTOM PROCEDURE TRAY) ×2 IMPLANT
PAD ARMBOARD 7.5X6 YLW CONV (MISCELLANEOUS) ×6 IMPLANT
PEEK CAGE 7X14X11 (Cage) ×4 IMPLANT
PLATE ELITE 42MM (Plate) ×2 IMPLANT
RUBBERBAND STERILE (MISCELLANEOUS) ×4 IMPLANT
SCREW ST 13X4XST VA NS SPNE (Screw) ×6 IMPLANT
SCREW ST VAR 4 ATL (Screw) ×6 IMPLANT
SPONGE GAUZE 4X4 12PLY (GAUZE/BANDAGES/DRESSINGS) ×2 IMPLANT
SPONGE INTESTINAL PEANUT (DISPOSABLE) ×2 IMPLANT
SPONGE SURGIFOAM ABS GEL SZ50 (HEMOSTASIS) ×2 IMPLANT
STRIP CLOSURE SKIN 1/2X4 (GAUZE/BANDAGES/DRESSINGS) ×2 IMPLANT
SUT PDS AB 5-0 P3 18 (SUTURE) ×2 IMPLANT
SUT VIC AB 3-0 SH 8-18 (SUTURE) ×2 IMPLANT
SYR 20ML ECCENTRIC (SYRINGE) ×2 IMPLANT
TAPE CLOTH 4X10 WHT NS (GAUZE/BANDAGES/DRESSINGS) IMPLANT
TAPE CLOTH SURG 4X10 WHT LF (GAUZE/BANDAGES/DRESSINGS) ×2 IMPLANT
TOWEL OR 17X24 6PK STRL BLUE (TOWEL DISPOSABLE) ×2 IMPLANT
TOWEL OR 17X26 10 PK STRL BLUE (TOWEL DISPOSABLE) ×2 IMPLANT
TRAP SPECIMEN MUCOUS 40CC (MISCELLANEOUS) ×4 IMPLANT
WATER STERILE IRR 1000ML POUR (IV SOLUTION) ×2 IMPLANT

## 2013-09-17 NOTE — Brief Op Note (Signed)
09/17/2013  1:30 PM  PATIENT:  James Logan  67 y.o. male  PRE-OPERATIVE DIAGNOSIS:  spondylosis  POST-OPERATIVE DIAGNOSIS:  spondylosis  PROCEDURE:  Procedure(s): ANTERIOR CERVICAL DECOMPRESSION/DISCECTOMY FUSION 2 LEVELS (N/A)  SURGEON:  Surgeon(s) and Role:    * Temple Pacini, MD - Primary    * Cristi Loron, MD - Assisting  PHYSICIAN ASSISTANT:   ASSISTANTS:    ANESTHESIA:   general  EBL:  Total I/O In: 1200 [I.V.:1200] Out: 150 [Blood:150]  BLOOD ADMINISTERED:none  DRAINS: none   LOCAL MEDICATIONS USED:  NONE  SPECIMEN:  No Specimen  DISPOSITION OF SPECIMEN:  N/A  COUNTS:  YES  TOURNIQUET:  * No tourniquets in log *  DICTATION: .Dragon Dictation  PLAN OF CARE: Admit to inpatient   PATIENT DISPOSITION:  PACU - hemodynamically stable.   Delay start of Pharmacological VTE agent (>24hrs) due to surgical blood loss or risk of bleeding: yes

## 2013-09-17 NOTE — Anesthesia Preprocedure Evaluation (Addendum)
Anesthesia Evaluation  Patient identified by MRN, date of birth, ID band Patient awake    Reviewed: Allergy & Precautions, H&P , NPO status , Patient's Chart, lab work & pertinent test results, reviewed documented beta blocker date and time   History of Anesthesia Complications Negative for: history of anesthetic complications  Airway Mallampati: III TM Distance: >3 FB Neck ROM: Limited    Dental  (+) Teeth Intact and Dental Advisory Given   Pulmonary asthma , sleep apnea , pneumonia -, resolved, former smoker,  breath sounds clear to auscultation        Cardiovascular hypertension, Pt. on medications Rhythm:regular Rate:Normal     Neuro/Psych PSYCHIATRIC DISORDERS Depression    GI/Hepatic Neg liver ROS, GERD-  Medicated and Controlled,  Endo/Other  negative endocrine ROS  Renal/GU negative Renal ROS  negative genitourinary   Musculoskeletal   Abdominal   Peds  Hematology negative hematology ROS (+)   Anesthesia Other Findings See surgeon's H&P   Reproductive/Obstetrics negative OB ROS                          Anesthesia Physical Anesthesia Plan  ASA: III  Anesthesia Plan: General   Post-op Pain Management:    Induction: Intravenous  Airway Management Planned: Oral ETT and Video Laryngoscope Planned  Additional Equipment:   Intra-op Plan:   Post-operative Plan: Extubation in OR  Informed Consent: I have reviewed the patients History and Physical, chart, labs and discussed the procedure including the risks, benefits and alternatives for the proposed anesthesia with the patient or authorized representative who has indicated his/her understanding and acceptance.   Dental Advisory Given  Plan Discussed with: CRNA and Surgeon  Anesthesia Plan Comments:         Anesthesia Quick Evaluation

## 2013-09-17 NOTE — Preoperative (Signed)
Beta Blockers   Reason not to administer Beta Blockers:Not Applicable 

## 2013-09-17 NOTE — Progress Notes (Signed)
Care of pt assumed by MA Pailynn Vahey RN 

## 2013-09-17 NOTE — Anesthesia Postprocedure Evaluation (Signed)
Anesthesia Post Note  Patient: James Logan  Procedure(s) Performed: Procedure(s) (LRB): ANTERIOR CERVICAL DECOMPRESSION/DISCECTOMY FUSION 2 LEVELS (N/A)  Anesthesia type: General  Patient location: PACU  Post pain: Pain level controlled  Post assessment: Patient's Cardiovascular Status Stable  Last Vitals:  Filed Vitals:   09/17/13 1445  BP:   Pulse: 64  Temp:   Resp: 9    Post vital signs: Reviewed and stable  Level of consciousness: alert  Complications: No apparent anesthesia complications

## 2013-09-17 NOTE — Transfer of Care (Signed)
Immediate Anesthesia Transfer of Care Note  Patient: James Logan  Procedure(s) Performed: Procedure(s): ANTERIOR CERVICAL DECOMPRESSION/DISCECTOMY FUSION 2 LEVELS (N/A)  Patient Location: PACU  Anesthesia Type:General  Level of Consciousness: awake, alert , oriented and patient cooperative  Airway & Oxygen Therapy: Patient Spontanous Breathing and Patient connected to nasal cannula oxygen  Post-op Assessment: Report given to PACU RN and Post -op Vital signs reviewed and stable  Post vital signs: Reviewed  Complications: No apparent anesthesia complications

## 2013-09-17 NOTE — H&P (Signed)
James Logan is an 67 y.o. male.   Chief Complaint: Neck pain with bilateral arm weakness and numbness HPI: 67 year old male with neck pain with intermittent radiation into his upper extremities. Patient with associated numbness and weakness of both hands. Workup demonstrates evidence of severe spondylosis and stenosis with spinal cord signal abnormality at C4-5 and C5-6. A shoe with spontaneous fusion at C6-7. Patient presents now for two-level anterior cervical decompression and fusion.  Past Medical History  Diagnosis Date  . Incisional hernia     ventral  . Arthritis   . Depression   . Hypertension   . Asthma     as a child  . Pneumonia     hx of pneumonia  . Sleep apnea     Past Surgical History  Procedure Laterality Date  . 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 Tonsillectomy    . Elbow surgery Right 07/2011    tendon repair  . Knee arthroscopy w/ acl reconstruction Right 2000's  . Kidney stones      surgery, stone to large to pass    No family history on file. Social History:  reports that he quit smoking about 51 years ago. His smoking use included Cigarettes. He has a 5 pack-year smoking history. He has never used smokeless tobacco. He reports that he drinks alcohol. He reports that he does not use illicit drugs.  Allergies: No Known Allergies  Medications Prior to Admission  Medication Sig Dispense Refill  . amLODipine (NORVASC) 5 MG tablet Take 1 tablet (5 mg total) by mouth daily.  90 tablet  3  . azelastine (ASTELIN) 137 MCG/SPRAY nasal spray Place 1 spray into the nose daily as needed for rhinitis. Use in each nostril as directed      . ibuprofen (ADVIL,MOTRIN) 200 MG tablet Take 400 mg by mouth every 6 (six) hours as needed for pain.      [name] Ketotifen Fumarate (ALAWAY OP) Place 1 drop into both eyes daily as needed.      . meloxicam (MOBIC) 15 MG tablet Take 15 mg by mouth daily as needed for pain.       [name] omeprazole (PRILOSEC) 20 MG capsule Take 20 mg  by mouth as needed.         No results found for this or any previous visit (from the past 48 hour(s)). No results found.  Review of Systems  Constitutional: Negative.   HENT: Negative.   Eyes: Negative.   Respiratory: Negative.   Cardiovascular: Negative.   Gastrointestinal: Negative.   Genitourinary: Negative.   Musculoskeletal: Negative.   Skin: Negative.   Neurological: Negative.   Endo/Heme/Allergies: Negative.   Psychiatric/Behavioral: Negative.     Blood pressure 126/77, pulse 71, temperature 98.3 F (36.8 C), temperature source Oral, resp. rate 20, SpO2 98.00%. Physical Exam  Constitutional: He is oriented to person, place, and time. He appears well-developed and well-nourished. No distress.  HENT:  Head: Normocephalic and atraumatic.  Right Ear: External ear normal.  Left Ear: External ear normal.  Nose: Nose normal.  Mouth/Throat: Oropharynx is clear and moist.  Eyes: Conjunctivae and EOM are normal. Pupils are equal, round, and reactive to light.  Neck: Normal range of motion. Neck supple. No JVD present. No tracheal deviation present. No thyromegaly present.  Cardiovascular: Normal rate, regular rhythm, normal heart sounds and intact distal pulses.  Exam reveals no friction rub.   No murmur heard. Respiratory: Effort normal and breath sounds normal. No  stridor. No respiratory distress. He has no wheezes.  GI: Soft. Bowel sounds are normal. He exhibits no distension. There is no tenderness.  Musculoskeletal: Normal range of motion. He exhibits no edema and no tenderness.  Neurological: He is alert and oriented to person, place, and time. He has normal reflexes. No cranial nerve deficit. Coordination normal.  Skin: Skin is warm and dry. No rash noted. He is not diaphoretic. No erythema. No pallor.  Psychiatric: He has a normal mood and affect. His behavior is normal. Judgment and thought content normal.     Assessment/Plan  Cervical stenosis with myelopathy.  Plan to see 44, C5-6 anterior cervical discectomy with interbody peek cage, local autograft, and anterior plate instrumentation. Risks and benefits have been explained. Patient wishes to proceed.  Sharran Caratachea A 09/17/2013, 11:24 AM

## 2013-09-17 NOTE — Anesthesia Procedure Notes (Signed)
Procedure Name: Intubation Date/Time: 09/17/2013 11:38 AM Performed by: Lovie Chol Pre-anesthesia Checklist: Patient identified, Emergency Drugs available, Suction available, Patient being monitored and Timeout performed Patient Re-evaluated:Patient Re-evaluated prior to inductionOxygen Delivery Method: Circle system utilized Preoxygenation: Pre-oxygenation with 100% oxygen Intubation Type: IV induction Ventilation: Mask ventilation without difficulty Grade View: Grade I Tube type: Oral Tube size: 7.5 mm Number of attempts: 1 Airway Equipment and Method: Stylet and Video-laryngoscopy Placement Confirmation: positive ETCO2,  CO2 detector and breath sounds checked- equal and bilateral Secured at: 22 cm Tube secured with: Tape Dental Injury: Teeth and Oropharynx as per pre-operative assessment

## 2013-09-17 NOTE — Op Note (Signed)
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  Surgeon:Maleya Leever A.Jonerik Sliker, M.D.  Asst. Surgeon: Lovell Sheehan  Anesthesia: General  Indication: 67 year old male with neck and upper extremity symptoms consistent with an early cervical myelopathy. Workup demonstrates evidence of marked cervical stenosis with spinal cord compression and cervical spinal cord signal abnormality at C4-5 and C5-6. Patient presents now for 2 level anterior cervical decompression infusion in hopes of improving his symptoms.  Operative note: After induction of anesthesia, patient positioned supine with neck slightly extended and held in place with halter traction. Cervical region prepped and draped. Incision made overlying the C5 vertebral level. This is carried down sharply to the platysma. Is then divided vertically and dissection proceeded along the medial border of the sternocleidomastoid muscle and carotid sheath. Trachea and esophagus were mobilized and retracted towards the left. Prevertebral fascia stripped off the anterior spinal column. Longus colli muscles elevated bilaterally using electric R. Deep self-retaining traction placed intraoperative fluoroscopy used levels was confirmed. The space at C4-5 and C5-6 were incised with 15 blade in her her fashion. Wide disc space cleanout was achieved using pituitary rongeurs forward and backward L. Carlin curettes Kerrison rongeurs the high-speed drill. All elements the disc removed down to level posterior annulus. Microscope then brought into the field and used throughout the remainder of the discectomy. Remaining aspects of annulus and osteophytes removed using a high-speed drill down  to the level of the posterior longitudinal limb. Posterior longitudinal ligament was elevated and resected piecemeal fashion using Kerrison rongeurs. Underlying thecal sac was identified. Wide central decompression was then performed by undercutting the bodies of C4 and C5. Decompression MCH are of foramen. Wide anterior foraminotomies were then performed on course exiting C5 nerve roots bilaterally. This point a very thorough decompression had been achieved. There was no evidence of injury to thecal sac and nerve roots. Wound is then irrigated with saline solution. Gelfoam was placed topically for hemostasis. The procedure was then repeated at C5-6 again without complication. A 7 mm [employer] anatomic peek cages were then packed with morselized autograft harvested during the discectomy. He was then impacted in place recessed roughly 1 mm of the anterior cortical margin of C 45 and C56. A 42 mg last anterior cervical plate was a posterior the C4-5 and 6 levels. This infection or fluoroscopic guidance using 13 mm variable-angle screws. All 6 screws given a final tightening down to be solidly within bone. Locking screws and gauge at all levels. Final images revealed good position the bone graft hardware at proper upper level with normal alignment of the spine. Wounds that area one final time. Hemostasis was assured with bipolar cautery. Wounds and close in layers. Steri-Strips and sterile dressing were applied. There were no apparent complications. Patient tolerated the procedure well and he returns they're covering postop.

## 2013-09-18 MED ORDER — CYCLOBENZAPRINE HCL 10 MG PO TABS: 10.0000 mg | ORAL_TABLET | Freq: Three times a day (TID) | ORAL | Status: DC | PRN

## 2013-09-18 MED ORDER — HYDROCODONE-ACETAMINOPHEN 5-325 MG PO TABS: 1.0000 | ORAL_TABLET | Freq: Four times a day (QID) | ORAL | Status: DC | PRN

## 2013-09-18 NOTE — Progress Notes (Signed)
Pt and family given D/C instructions with Rx's, verbal understanding of teaching was given. Pt D/C'd home via wheelchair @ [phone] per MD order. Pt stable @ D/C and has no other needs @ this time. Rema Fendt, RN

## 2013-09-18 NOTE — Discharge Summary (Signed)
Physician Discharge Summary  Patient ID: James Logan MRN: [id number] DOB/AGE: Nov 28, 1945 67 y.o.  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 *   Discharged Condition: good  Hospital Course: Mr. Pry was admitted and taken to the operating room for a 2 level ACDF at C4/5,5/6 for cervical myelopathy. Post op he is weak in the deltoids bilaterally, voice is hoarse.Moving all other extremities well. Wound is clean, dry, and without signs of infection. He is voiding, ambulating, and tolerating a regular diet.  Consults: None  Significant Diagnostic Studies: none  Treatments: surgery: C4-5, C5-6 anterior cervical discectomy with interbody fusion utilizing peak interbody cage, local autograft, anterior plate instrumentation.   Discharge Exam: Blood pressure 149/84, pulse 83, temperature 98.1 F (36.7 C), temperature source Oral, resp. rate 18, SpO2 95.00%. General appearance: alert, cooperative, appears stated age and no distress Neurologic: Mental status: Alert, oriented, thought content appropriate Motor: bilateral weakness in the deltoids, unable to abduct upper extremities  Disposition: Final discharge disposition not confirmed     Medication List    STOP taking these medications       ibuprofen 200 MG tablet  Commonly known as:  ADVIL,MOTRIN     meloxicam 15 MG tablet  Commonly known as:  MOBIC      TAKE these medications       ALAWAY OP  Place 1 drop into both eyes daily as needed.     amLODipine 5 MG tablet  Commonly known as:  NORVASC  Take 1 tablet (5 mg total) by mouth daily.     azelastine 137 MCG/SPRAY nasal spray  Commonly known as:  ASTELIN  Place 1 spray into the nose daily as needed for rhinitis. Use in each nostril as directed     cyclobenzaprine 10 MG tablet  Commonly known as:  FLEXERIL  Take 1 tablet (10 mg total) by mouth 3 (three) times daily as needed for muscle spasms.     HYDROcodone-acetaminophen 5-325 MG per tablet  Commonly known as:  NORCO/VICODIN  Take 1-2 tablets by mouth every 6 (six) hours as needed for moderate pain.     omeprazole 20 MG capsule  Commonly known as:  PRILOSEC  Take 20 mg by mouth as needed.         Signed: Candance Bohlman L 09/18/2013, 7:22 AM

## 2013-09-20 ENCOUNTER — Encounter (HOSPITAL_COMMUNITY): Payer: Self-pay | Admitting: Neurosurgery

## 2013-10-04 NOTE — Progress Notes (Signed)
This encounter was created in error - please disregard.

## 2013-10-21 ENCOUNTER — Telehealth: Payer: Self-pay | Admitting: Internal Medicine

## 2013-10-21 DIAGNOSIS — I10 Essential (primary) hypertension: Secondary | ICD-10-CM | POA: Diagnosis not present

## 2013-10-21 DIAGNOSIS — M4712 Other spondylosis with myelopathy, cervical region: Secondary | ICD-10-CM | POA: Diagnosis not present

## 2013-10-21 MED ORDER — CYCLOBENZAPRINE HCL 10 MG PO TABS: 10.0000 mg | ORAL_TABLET | Freq: Three times a day (TID) | ORAL | Status: DC | PRN

## 2013-10-21 MED ORDER — AZELASTINE HCL 0.1 % NA SOLN: 1.0000 | Freq: Two times a day (BID) | NASAL | Status: DC

## 2013-10-21 NOTE — Telephone Encounter (Signed)
Astepro 0.15%  Spray each nostril 1 time as needed

## 2013-10-21 NOTE — Telephone Encounter (Signed)
Ok to refill? Has Astelin on med list

## 2013-10-21 NOTE — Telephone Encounter (Signed)
I'm not sure if the previous message and Flexeril refill were for this pt? Note only referred to Astepro refill?

## 2013-10-21 NOTE — Telephone Encounter (Signed)
Referral is in process as requested 

## 2013-10-21 NOTE — Telephone Encounter (Signed)
Oops, my mistake,  Both sent now. thanks

## 2013-11-12 DIAGNOSIS — M4712 Other spondylosis with myelopathy, cervical region: Secondary | ICD-10-CM | POA: Diagnosis not present

## 2013-11-12 DIAGNOSIS — I10 Essential (primary) hypertension: Secondary | ICD-10-CM | POA: Diagnosis not present

## 2013-12-24 DIAGNOSIS — M4712 Other spondylosis with myelopathy, cervical region: Secondary | ICD-10-CM | POA: Diagnosis not present

## 2013-12-24 DIAGNOSIS — E663 Overweight: Secondary | ICD-10-CM | POA: Diagnosis not present

## 2014-01-06 DIAGNOSIS — M1710 Unilateral primary osteoarthritis, unspecified knee: Secondary | ICD-10-CM | POA: Diagnosis not present

## 2014-01-06 DIAGNOSIS — IMO0002 Reserved for concepts with insufficient information to code with codable children: Secondary | ICD-10-CM | POA: Diagnosis not present

## 2014-01-10 DIAGNOSIS — M25569 Pain in unspecified knee: Secondary | ICD-10-CM | POA: Diagnosis not present

## 2014-01-10 DIAGNOSIS — M25669 Stiffness of unspecified knee, not elsewhere classified: Secondary | ICD-10-CM | POA: Diagnosis not present

## 2014-01-10 DIAGNOSIS — M6281 Muscle weakness (generalized): Secondary | ICD-10-CM | POA: Diagnosis not present

## 2014-01-14 DIAGNOSIS — M25569 Pain in unspecified knee: Secondary | ICD-10-CM | POA: Diagnosis not present

## 2014-01-14 DIAGNOSIS — M6281 Muscle weakness (generalized): Secondary | ICD-10-CM | POA: Diagnosis not present

## 2014-01-14 DIAGNOSIS — M25669 Stiffness of unspecified knee, not elsewhere classified: Secondary | ICD-10-CM | POA: Diagnosis not present

## 2014-01-17 DIAGNOSIS — M25569 Pain in unspecified knee: Secondary | ICD-10-CM | POA: Diagnosis not present

## 2014-01-17 DIAGNOSIS — M6281 Muscle weakness (generalized): Secondary | ICD-10-CM | POA: Diagnosis not present

## 2014-01-17 DIAGNOSIS — M25669 Stiffness of unspecified knee, not elsewhere classified: Secondary | ICD-10-CM | POA: Diagnosis not present

## 2014-01-20 DIAGNOSIS — M6281 Muscle weakness (generalized): Secondary | ICD-10-CM | POA: Diagnosis not present

## 2014-01-20 DIAGNOSIS — M25569 Pain in unspecified knee: Secondary | ICD-10-CM | POA: Diagnosis not present

## 2014-01-20 DIAGNOSIS — M25669 Stiffness of unspecified knee, not elsewhere classified: Secondary | ICD-10-CM | POA: Diagnosis not present

## 2014-03-03 DIAGNOSIS — M4712 Other spondylosis with myelopathy, cervical region: Secondary | ICD-10-CM | POA: Diagnosis not present

## 2014-03-03 DIAGNOSIS — I10 Essential (primary) hypertension: Secondary | ICD-10-CM | POA: Diagnosis not present

## 2014-03-03 DIAGNOSIS — IMO0002 Reserved for concepts with insufficient information to code with codable children: Secondary | ICD-10-CM | POA: Diagnosis not present

## 2014-03-03 DIAGNOSIS — M1710 Unilateral primary osteoarthritis, unspecified knee: Secondary | ICD-10-CM | POA: Diagnosis not present

## 2014-04-11 DIAGNOSIS — M5430 Sciatica, unspecified side: Secondary | ICD-10-CM | POA: Diagnosis not present

## 2014-04-11 DIAGNOSIS — IMO0002 Reserved for concepts with insufficient information to code with codable children: Secondary | ICD-10-CM | POA: Diagnosis not present

## 2014-04-11 DIAGNOSIS — M531 Cervicobrachial syndrome: Secondary | ICD-10-CM | POA: Diagnosis not present

## 2014-04-11 DIAGNOSIS — M999 Biomechanical lesion, unspecified: Secondary | ICD-10-CM | POA: Diagnosis not present

## 2014-04-11 DIAGNOSIS — M9981 Other biomechanical lesions of cervical region: Secondary | ICD-10-CM | POA: Diagnosis not present

## 2014-04-15 DIAGNOSIS — M5430 Sciatica, unspecified side: Secondary | ICD-10-CM | POA: Diagnosis not present

## 2014-04-15 DIAGNOSIS — M5137 Other intervertebral disc degeneration, lumbosacral region: Secondary | ICD-10-CM | POA: Diagnosis not present

## 2014-04-15 DIAGNOSIS — M999 Biomechanical lesion, unspecified: Secondary | ICD-10-CM | POA: Diagnosis not present

## 2014-04-15 DIAGNOSIS — IMO0002 Reserved for concepts with insufficient information to code with codable children: Secondary | ICD-10-CM | POA: Diagnosis not present

## 2014-04-22 DIAGNOSIS — M5430 Sciatica, unspecified side: Secondary | ICD-10-CM | POA: Diagnosis not present

## 2014-04-22 DIAGNOSIS — M5137 Other intervertebral disc degeneration, lumbosacral region: Secondary | ICD-10-CM | POA: Diagnosis not present

## 2014-04-22 DIAGNOSIS — M999 Biomechanical lesion, unspecified: Secondary | ICD-10-CM | POA: Diagnosis not present

## 2014-04-22 DIAGNOSIS — IMO0002 Reserved for concepts with insufficient information to code with codable children: Secondary | ICD-10-CM | POA: Diagnosis not present

## 2014-05-03 DIAGNOSIS — IMO0002 Reserved for concepts with insufficient information to code with codable children: Secondary | ICD-10-CM | POA: Diagnosis not present

## 2014-05-03 DIAGNOSIS — M5430 Sciatica, unspecified side: Secondary | ICD-10-CM | POA: Diagnosis not present

## 2014-05-03 DIAGNOSIS — M5137 Other intervertebral disc degeneration, lumbosacral region: Secondary | ICD-10-CM | POA: Diagnosis not present

## 2014-05-03 DIAGNOSIS — M999 Biomechanical lesion, unspecified: Secondary | ICD-10-CM | POA: Diagnosis not present

## 2014-05-04 DIAGNOSIS — M4712 Other spondylosis with myelopathy, cervical region: Secondary | ICD-10-CM | POA: Diagnosis not present

## 2014-05-13 DIAGNOSIS — IMO0002 Reserved for concepts with insufficient information to code with codable children: Secondary | ICD-10-CM | POA: Diagnosis not present

## 2014-05-13 DIAGNOSIS — M5430 Sciatica, unspecified side: Secondary | ICD-10-CM | POA: Diagnosis not present

## 2014-05-13 DIAGNOSIS — M5137 Other intervertebral disc degeneration, lumbosacral region: Secondary | ICD-10-CM | POA: Diagnosis not present

## 2014-05-13 DIAGNOSIS — M999 Biomechanical lesion, unspecified: Secondary | ICD-10-CM | POA: Diagnosis not present

## 2014-05-27 DIAGNOSIS — M999 Biomechanical lesion, unspecified: Secondary | ICD-10-CM | POA: Diagnosis not present

## 2014-05-27 DIAGNOSIS — M5430 Sciatica, unspecified side: Secondary | ICD-10-CM | POA: Diagnosis not present

## 2014-05-27 DIAGNOSIS — M5137 Other intervertebral disc degeneration, lumbosacral region: Secondary | ICD-10-CM | POA: Diagnosis not present

## 2014-05-27 DIAGNOSIS — IMO0002 Reserved for concepts with insufficient information to code with codable children: Secondary | ICD-10-CM | POA: Diagnosis not present

## 2014-06-10 DIAGNOSIS — M5137 Other intervertebral disc degeneration, lumbosacral region: Secondary | ICD-10-CM | POA: Diagnosis not present

## 2014-06-10 DIAGNOSIS — M999 Biomechanical lesion, unspecified: Secondary | ICD-10-CM | POA: Diagnosis not present

## 2014-06-10 DIAGNOSIS — M5430 Sciatica, unspecified side: Secondary | ICD-10-CM | POA: Diagnosis not present

## 2014-06-10 DIAGNOSIS — IMO0002 Reserved for concepts with insufficient information to code with codable children: Secondary | ICD-10-CM | POA: Diagnosis not present

## 2014-07-01 DIAGNOSIS — M999 Biomechanical lesion, unspecified: Secondary | ICD-10-CM | POA: Diagnosis not present

## 2014-07-01 DIAGNOSIS — M5137 Other intervertebral disc degeneration, lumbosacral region: Secondary | ICD-10-CM | POA: Diagnosis not present

## 2014-07-01 DIAGNOSIS — IMO0002 Reserved for concepts with insufficient information to code with codable children: Secondary | ICD-10-CM | POA: Diagnosis not present

## 2014-07-01 DIAGNOSIS — M5430 Sciatica, unspecified side: Secondary | ICD-10-CM | POA: Diagnosis not present

## 2014-07-15 DIAGNOSIS — IMO0002 Reserved for concepts with insufficient information to code with codable children: Secondary | ICD-10-CM | POA: Diagnosis not present

## 2014-07-15 DIAGNOSIS — M5430 Sciatica, unspecified side: Secondary | ICD-10-CM | POA: Diagnosis not present

## 2014-07-15 DIAGNOSIS — M999 Biomechanical lesion, unspecified: Secondary | ICD-10-CM | POA: Diagnosis not present

## 2014-07-15 DIAGNOSIS — M5137 Other intervertebral disc degeneration, lumbosacral region: Secondary | ICD-10-CM | POA: Diagnosis not present

## 2014-07-27 DIAGNOSIS — M4712 Other spondylosis with myelopathy, cervical region: Secondary | ICD-10-CM | POA: Diagnosis not present

## 2014-07-27 DIAGNOSIS — Z23 Encounter for immunization: Secondary | ICD-10-CM | POA: Diagnosis not present

## 2014-08-05 DIAGNOSIS — M9901 Segmental and somatic dysfunction of cervical region: Secondary | ICD-10-CM | POA: Diagnosis not present

## 2014-08-05 DIAGNOSIS — M5441 Lumbago with sciatica, right side: Secondary | ICD-10-CM | POA: Diagnosis not present

## 2014-08-05 DIAGNOSIS — M5414 Radiculopathy, thoracic region: Secondary | ICD-10-CM | POA: Diagnosis not present

## 2014-08-05 DIAGNOSIS — M9902 Segmental and somatic dysfunction of thoracic region: Secondary | ICD-10-CM | POA: Diagnosis not present

## 2014-08-05 DIAGNOSIS — M4322 Fusion of spine, cervical region: Secondary | ICD-10-CM | POA: Diagnosis not present

## 2014-08-05 DIAGNOSIS — M9903 Segmental and somatic dysfunction of lumbar region: Secondary | ICD-10-CM | POA: Diagnosis not present

## 2014-08-05 DIAGNOSIS — M5442 Lumbago with sciatica, left side: Secondary | ICD-10-CM | POA: Diagnosis not present

## 2014-08-05 DIAGNOSIS — M531 Cervicobrachial syndrome: Secondary | ICD-10-CM | POA: Diagnosis not present

## 2014-08-26 DIAGNOSIS — M9901 Segmental and somatic dysfunction of cervical region: Secondary | ICD-10-CM | POA: Diagnosis not present

## 2014-08-26 DIAGNOSIS — M9903 Segmental and somatic dysfunction of lumbar region: Secondary | ICD-10-CM | POA: Diagnosis not present

## 2014-08-26 DIAGNOSIS — M5442 Lumbago with sciatica, left side: Secondary | ICD-10-CM | POA: Diagnosis not present

## 2014-08-26 DIAGNOSIS — M9902 Segmental and somatic dysfunction of thoracic region: Secondary | ICD-10-CM | POA: Diagnosis not present

## 2014-08-26 DIAGNOSIS — M5441 Lumbago with sciatica, right side: Secondary | ICD-10-CM | POA: Diagnosis not present

## 2014-08-26 DIAGNOSIS — M5414 Radiculopathy, thoracic region: Secondary | ICD-10-CM | POA: Diagnosis not present

## 2014-08-26 DIAGNOSIS — M531 Cervicobrachial syndrome: Secondary | ICD-10-CM | POA: Diagnosis not present

## 2014-08-26 DIAGNOSIS — M4322 Fusion of spine, cervical region: Secondary | ICD-10-CM | POA: Diagnosis not present

## 2014-09-01 ENCOUNTER — Telehealth: Payer: Self-pay

## 2014-09-01 DIAGNOSIS — R5383 Other fatigue: Secondary | ICD-10-CM

## 2014-09-01 DIAGNOSIS — E785 Hyperlipidemia, unspecified: Secondary | ICD-10-CM

## 2014-09-01 MED ORDER — MELOXICAM 15 MG PO TABS: 15.0000 mg | ORAL_TABLET | Freq: Every day | ORAL | Status: DC

## 2014-09-01 NOTE — Telephone Encounter (Signed)
Pt states as per conversation on Monday with Dr Derrel Nip; Rx would be filled without an appoint.  Please advise

## 2014-09-01 NOTE — Telephone Encounter (Signed)
The wife called and stated the patient needs a 3 month supply of meloxacan (spelling?).  She mentioned she was in on Monday (10/26) and she was under the impression this medication could be refilled without an appointment.   Please advise.

## 2014-09-01 NOTE — Telephone Encounter (Signed)
I will refill 30 day supply only, since he has not been seen by me or had  His renal function checked in a year. He needs to make appt for fasting labs, and ann OV

## 2014-09-02 NOTE — Telephone Encounter (Signed)
Spoke with pt advised Rx sent to pharmacy, transferred pt to scheduling for appoints

## 2014-09-08 ENCOUNTER — Other Ambulatory Visit (INDEPENDENT_AMBULATORY_CARE_PROVIDER_SITE_OTHER): Payer: Medicare Other

## 2014-09-08 DIAGNOSIS — R5383 Other fatigue: Secondary | ICD-10-CM | POA: Diagnosis not present

## 2014-09-08 DIAGNOSIS — E785 Hyperlipidemia, unspecified: Secondary | ICD-10-CM | POA: Diagnosis not present

## 2014-09-08 LAB — CBC WITH DIFFERENTIAL/PLATELET
BASOS PCT: 1 % (ref 0.0–3.0)
Basophils Absolute: 0.1 10*3/uL (ref 0.0–0.1)
Eosinophils Absolute: 0.2 10*3/uL (ref 0.0–0.7)
Eosinophils Relative: 1.8 % (ref 0.0–5.0)
HCT: 49 % (ref 39.0–52.0)
HEMOGLOBIN: 16.4 g/dL (ref 13.0–17.0)
Lymphocytes Relative: 47 % — ABNORMAL HIGH (ref 12.0–46.0)
Lymphs Abs: 4 10*3/uL (ref 0.7–4.0)
MCHC: 33.4 g/dL (ref 30.0–36.0)
MCV: 99.5 fl (ref 78.0–100.0)
Monocytes Absolute: 0.8 10*3/uL (ref 0.1–1.0)
Monocytes Relative: 9.5 % (ref 3.0–12.0)
Neutro Abs: 3.5 10*3/uL (ref 1.4–7.7)
Neutrophils Relative %: 40.7 % — ABNORMAL LOW (ref 43.0–77.0)
Platelets: 346 10*3/uL (ref 150.0–400.0)
RBC: 4.92 Mil/uL (ref 4.22–5.81)
RDW: 13.3 % (ref 11.5–15.5)
WBC: 8.6 10*3/uL (ref 4.0–10.5)

## 2014-09-08 LAB — COMPREHENSIVE METABOLIC PANEL
ALT: 20 U/L (ref 0–53)
AST: 24 U/L (ref 0–37)
Albumin: 3.6 g/dL (ref 3.5–5.2)
Alkaline Phosphatase: 66 U/L (ref 39–117)
BILIRUBIN TOTAL: 0.6 mg/dL (ref 0.2–1.2)
BUN: 20 mg/dL (ref 6–23)
CO2: 28 mEq/L (ref 19–32)
Calcium: 9.3 mg/dL (ref 8.4–10.5)
Chloride: 104 mEq/L (ref 96–112)
Creatinine, Ser: 1.1 mg/dL (ref 0.4–1.5)
GFR: 72.86 mL/min (ref >60.00)
Glucose, Bld: 88 mg/dL (ref 70–99)
Potassium: 4.8 mEq/L (ref 3.5–5.1)
Sodium: 139 mEq/L (ref 135–145)
Total Protein: 7.1 g/dL (ref 6.0–8.3)

## 2014-09-08 LAB — LIPID PANEL
Cholesterol: 187 mg/dL (ref 0–200)
HDL: 34.1 mg/dL — ABNORMAL LOW (ref >39.00)
LDL CALC: 122 mg/dL — AB (ref 0–99)
NonHDL: 152.9
Total CHOL/HDL Ratio: 5
Triglycerides: 153 mg/dL — ABNORMAL HIGH (ref 0.0–149.0)
VLDL: 30.6 mg/dL (ref 0.0–40.0)

## 2014-09-08 LAB — TSH: TSH: 2.55 u[IU]/mL (ref 0.35–4.50)

## 2014-09-12 ENCOUNTER — Encounter: Payer: Self-pay | Admitting: *Deleted

## 2014-09-17 IMAGING — RF DG BARIUM SWALLOW
7 series · 14 of 24 positions shown · non-contrast
Comparison: none

REASON FOR EXAM: Reflux
COMMENTS:

[Series 1: fluoro_barium 2fps_bw · 0.19mm/px · 2 of 28 frames shown (1 of 7)]
[frame 5/28]
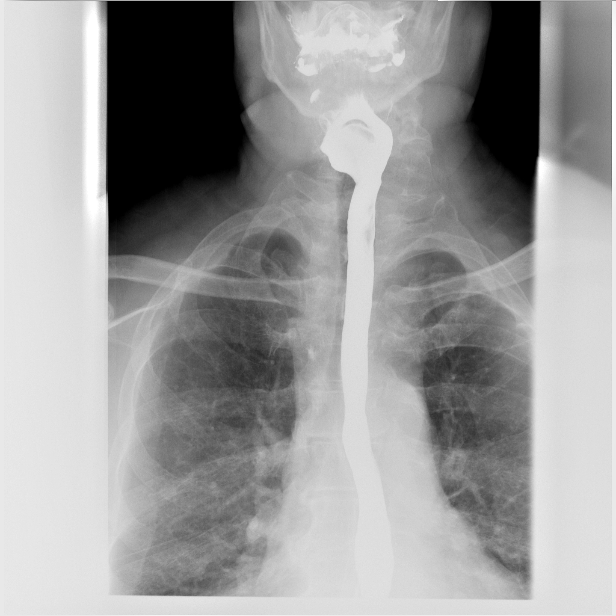
[frame 22/28]
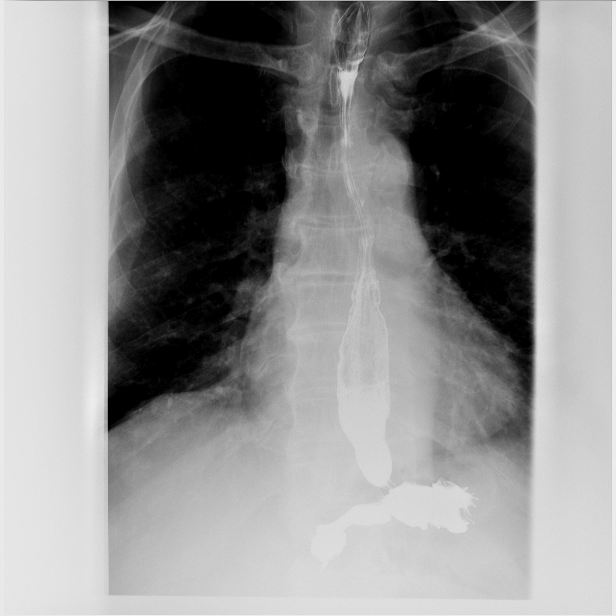

[Series 2: fluoro_barium 2fps_bw · 0.19mm/px · 2 of 14 frames shown (2 of 7)]
[frame 2/14]
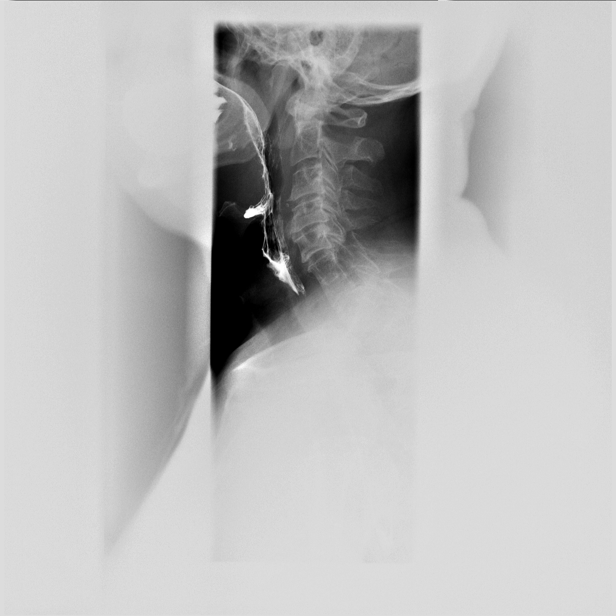
[frame 12/14]
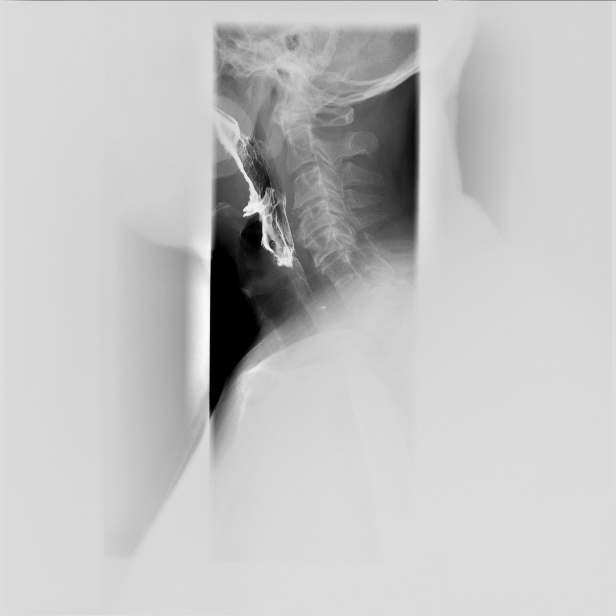

[Series 3: fluoro_barium 2fps_bw · 0.19mm/px · 2 of 3 frames shown (3 of 7)]
[frame 1/3]
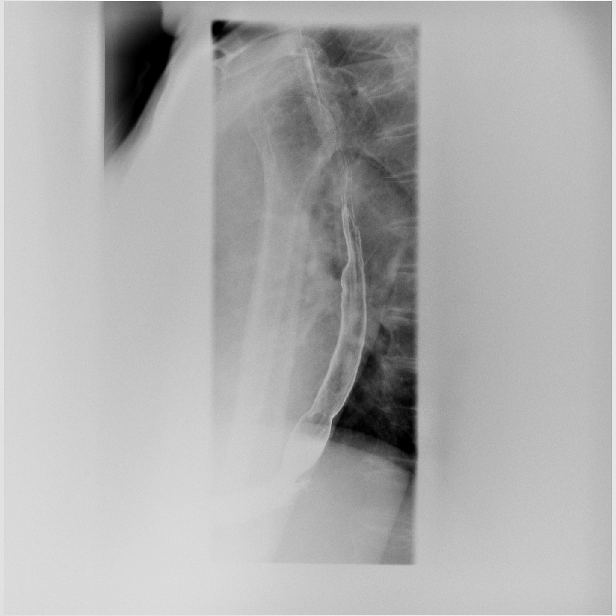
[frame 3/3]
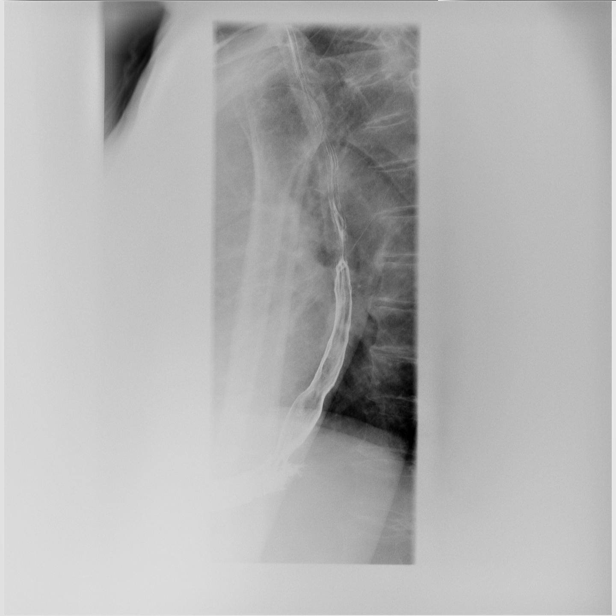

[Series 4: fluoro_barium 2fps_bw · 0.19mm/px · 2 of 32 frames shown (4 of 7)]
[frame 16/32]
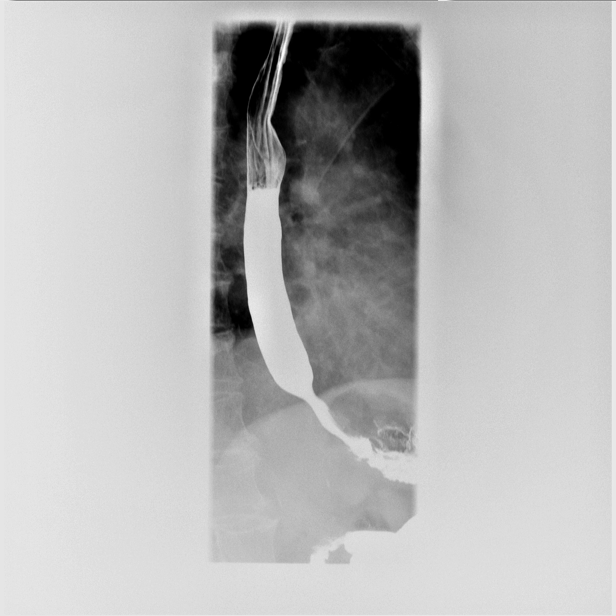
[frame 17/32]
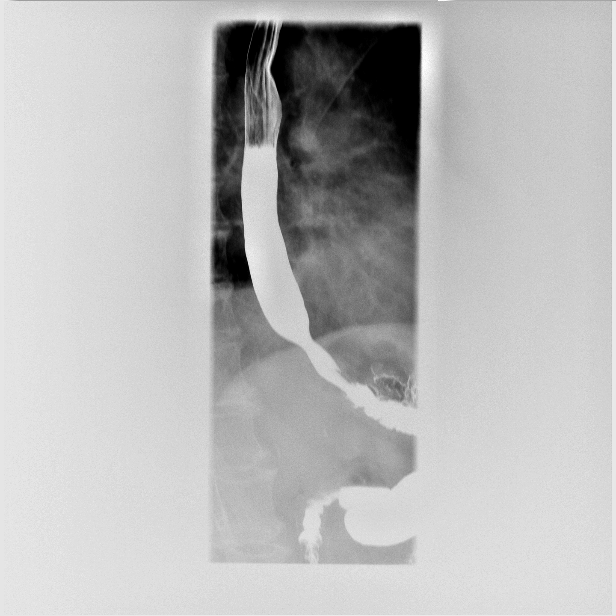

[Series 5: fluoro_barium 2fps_bw · 0.18mm/px · 2 of 40 frames shown (5 of 7)]
[frame 7/40]
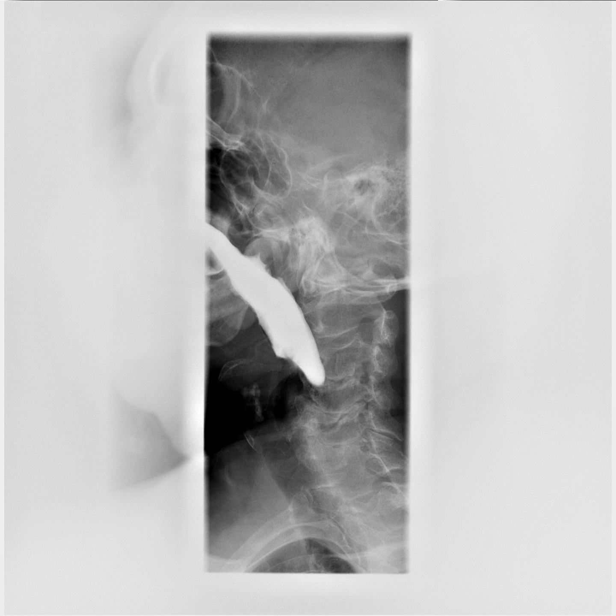
[frame 32/40]
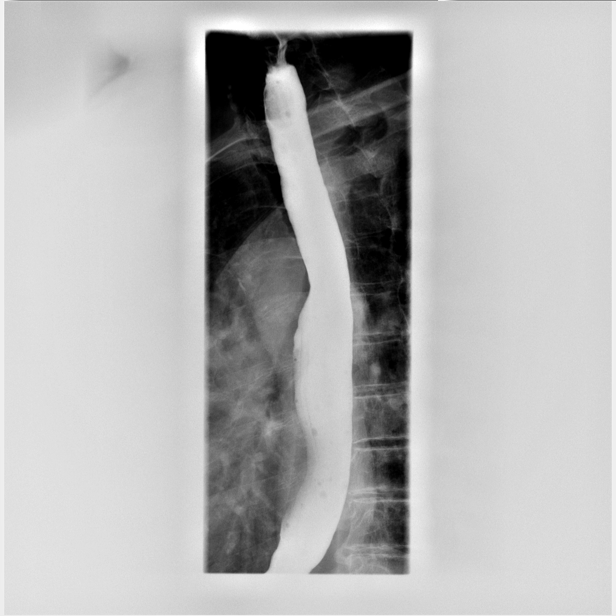

[Series 6: fluoro_barium 2fps_bw · 0.18mm/px · 2 of 11 frames shown (6 of 7)]
[frame 2/11]
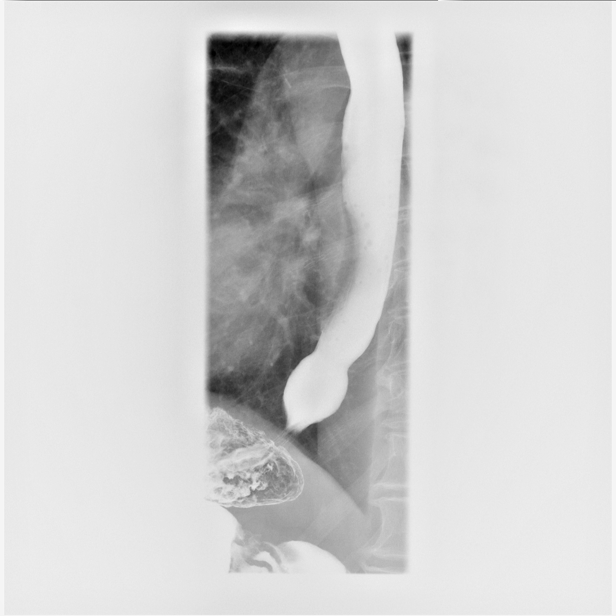
[frame 6/11]
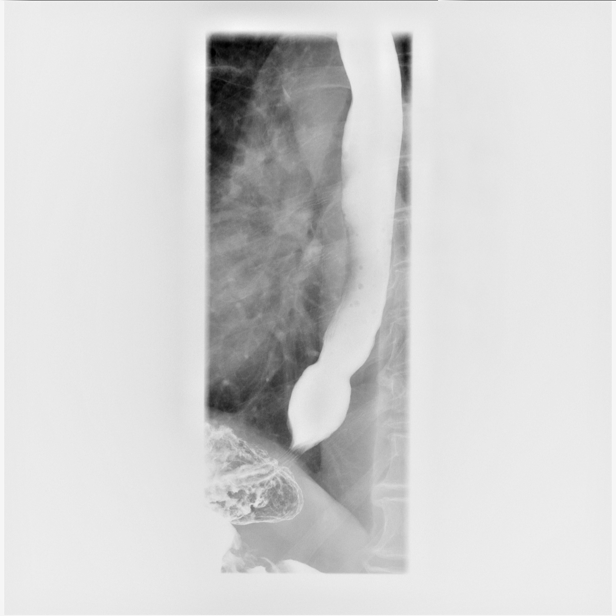

[Series 7: fluoro_barium 2fps_bw · 0.18mm/px · 2 of 8 frames shown (7 of 7)]
[frame 2/8]
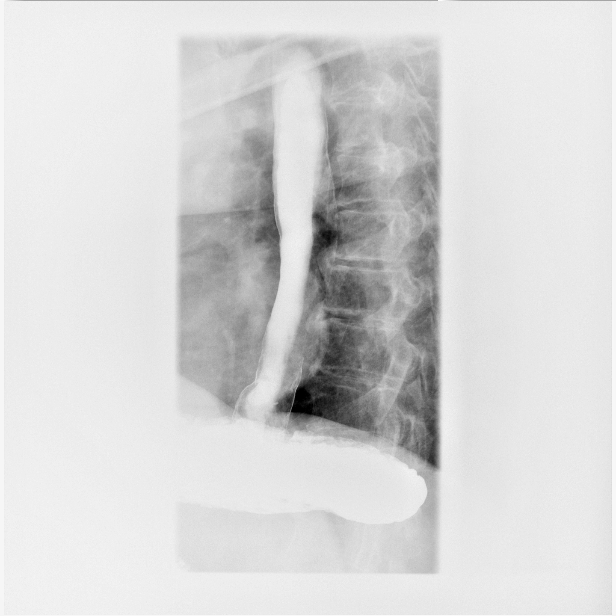
[frame 7/8]
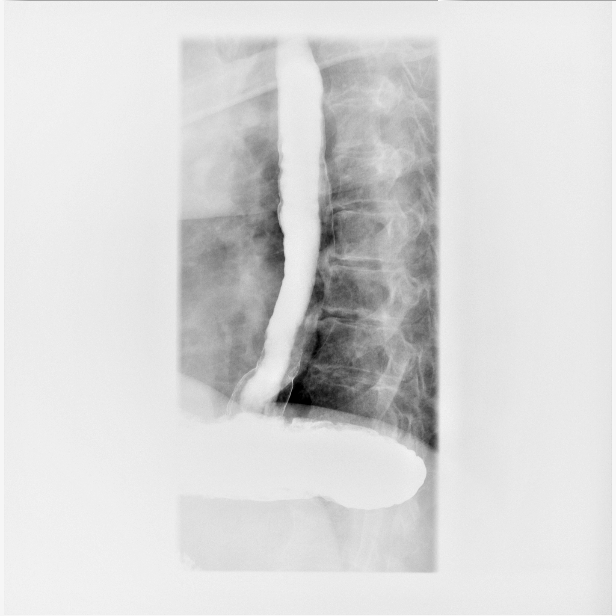

[14 of 24 positions shown; findings below may reference images not displayed]

PROCEDURE:     FL  - FL BARIUM SWALLOW  - July 27, 2012  [DATE]

RESULT:     Barium swallow examination is performed. The patient easily
ingested the barium. Barium passed through the esophagus without obstruction
or significant narrowing. A 12.5 mm barium-impregnated tablet passed easily
through the esophagus into the stomach again, without obstruction. There is
a slight curvature of the barium column to the left and anteriorly in the
lower cervical region which could be from a slightly prominent
cricopharyngeus muscle. Again, this did not hinder passage of a barium
tablet. No esophageal diverticulum is evident. There is no evidence of a
hiatal hernia. No significant gastroesophageal reflux could be demonstrated
during reflux maneuvers.
IMPRESSION: 1. Slight prominence of the cricopharyngeus. No obstruction or mucosal
abnormality. No definite hiatal hernia or gastroesophageal reflux during the
exam.

[REDACTED]

## 2014-09-17 IMAGING — US ABDOMEN ULTRASOUND
1 series · 14 of 25 positions shown · non-contrast
Comparison: none

REASON FOR EXAM: Abdominal pain
COMMENTS:

PROCEDURE:     US  - US ABDOMEN GENERAL SURVEY  - July 27, 2012  [DATE]
RESULT:     Comparison: None
TECHNIQUE: Multiple gray-scale and color-flow Doppler images of the abdomen
are presented for review.

[Series 1: abdomen ultrasound · 0.31mm/px · 14 of 70 slices shown]
[im 1/70]
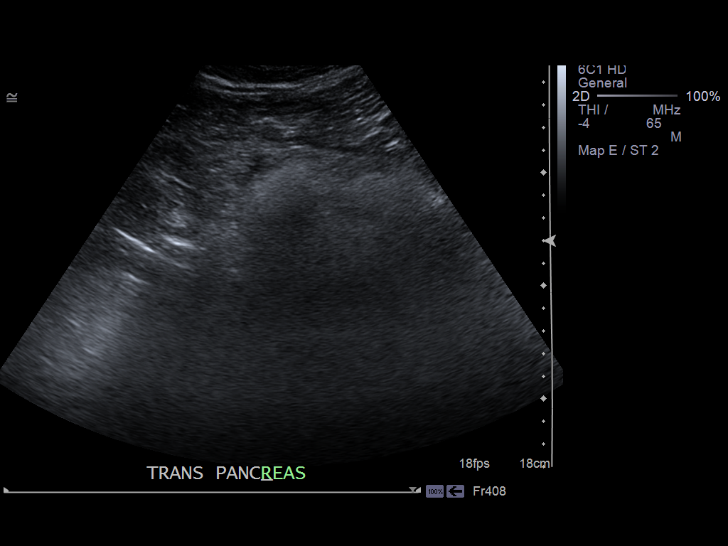
[im 6/70]
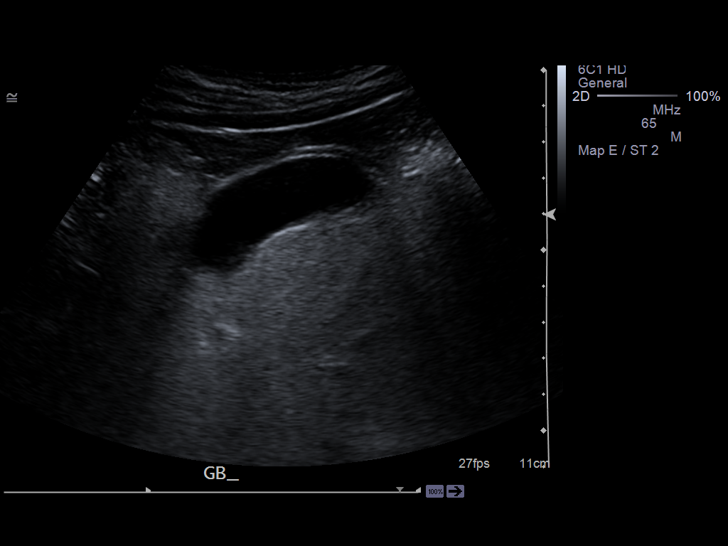
[im 12/70]
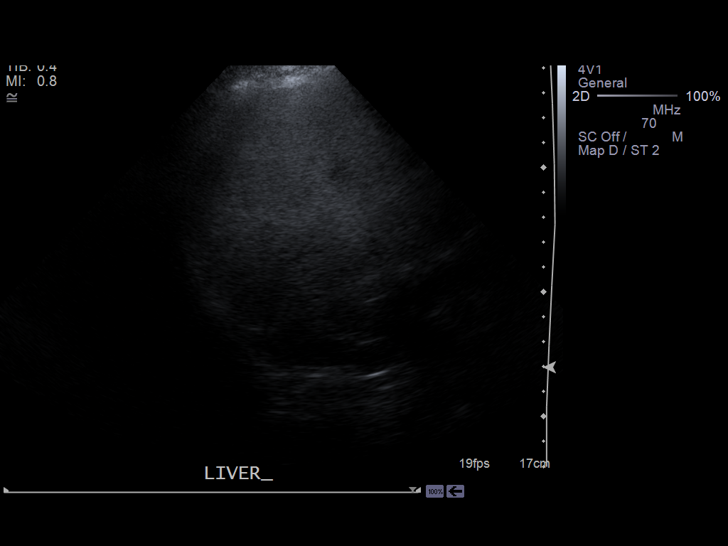
[im 18/70]
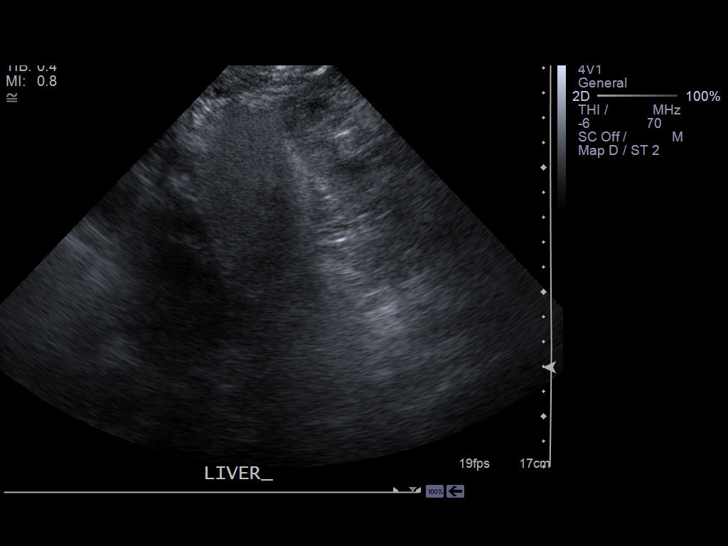
[im 24/70]
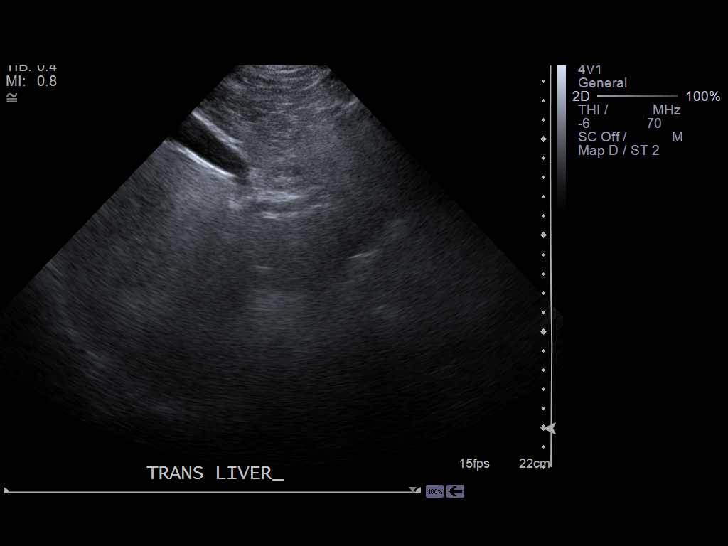
[im 26/70]
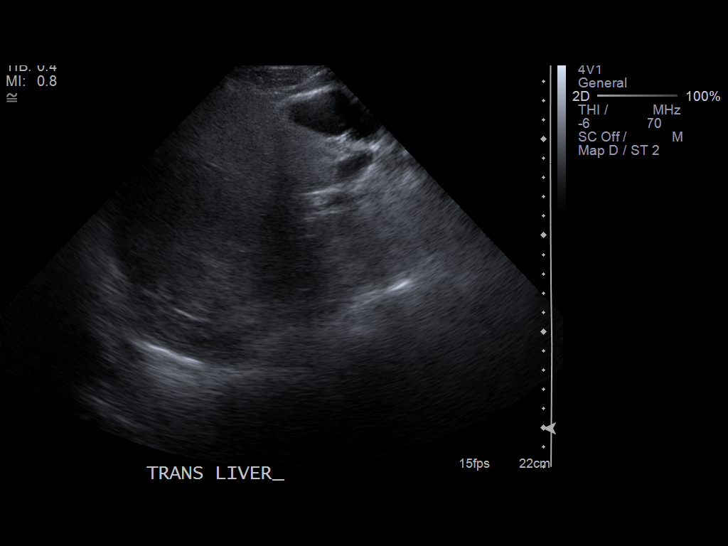
[im 32/70]
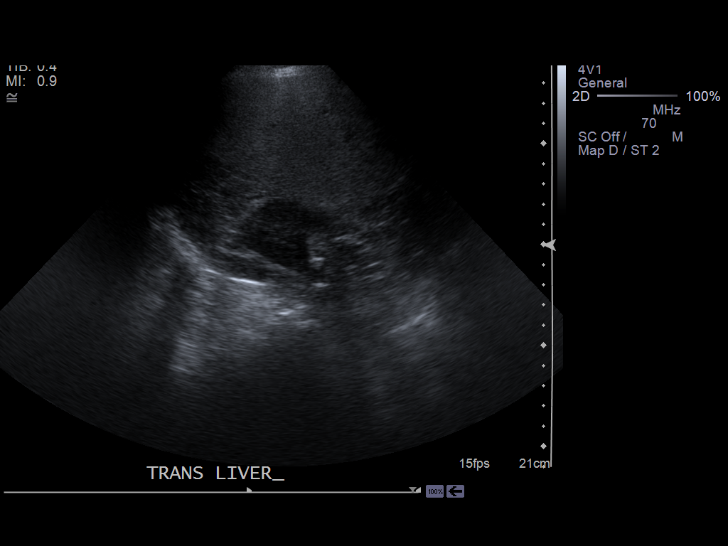
[im 38/70]
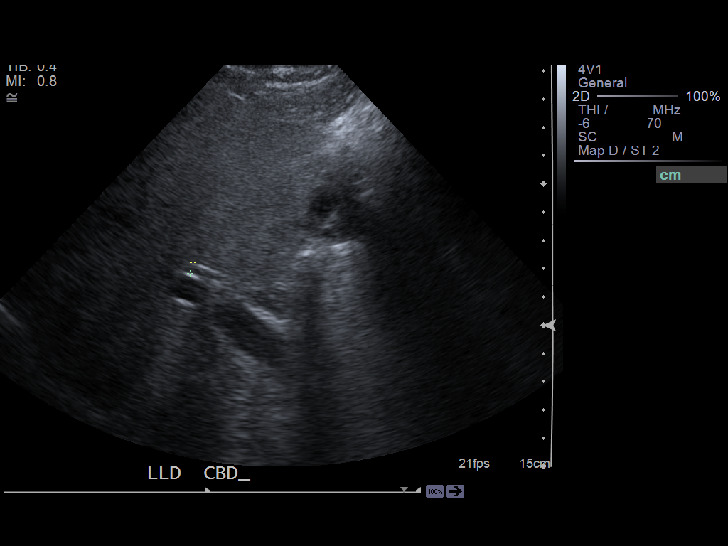
[im 44/70]
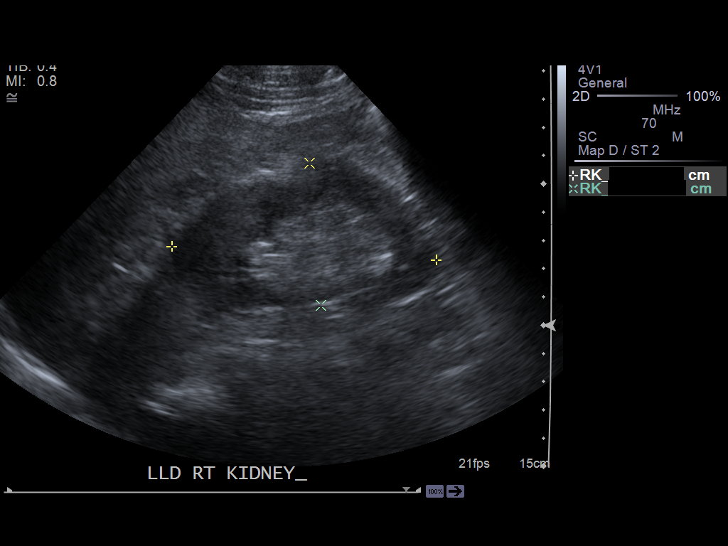
[im 47/70]
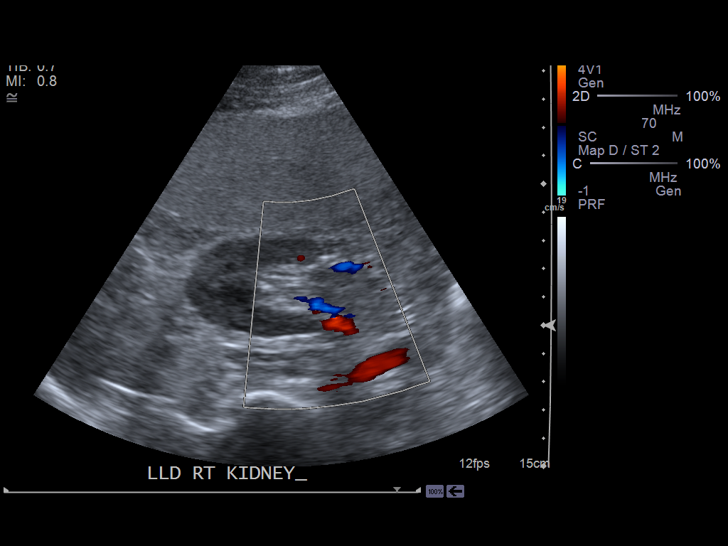
[im 52/70]
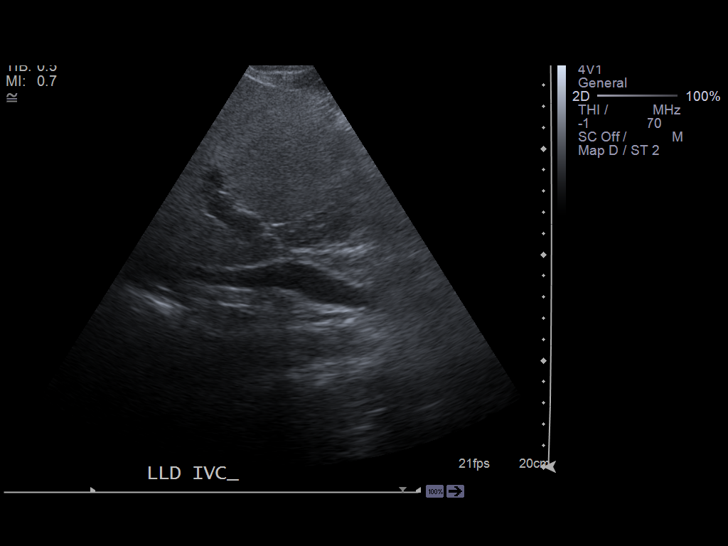
[im 58/70]
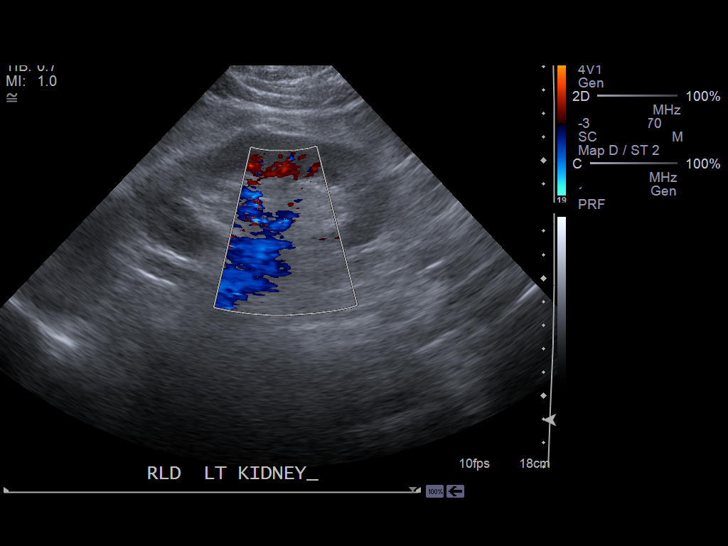
[im 64/70]
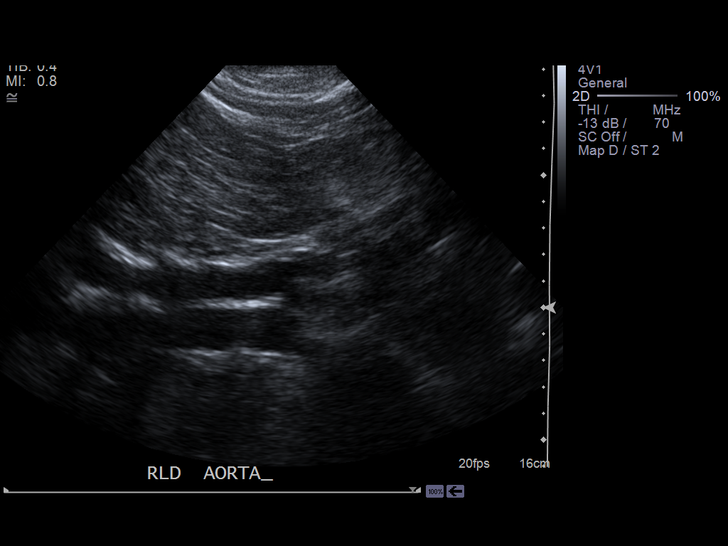
[im 70/70]
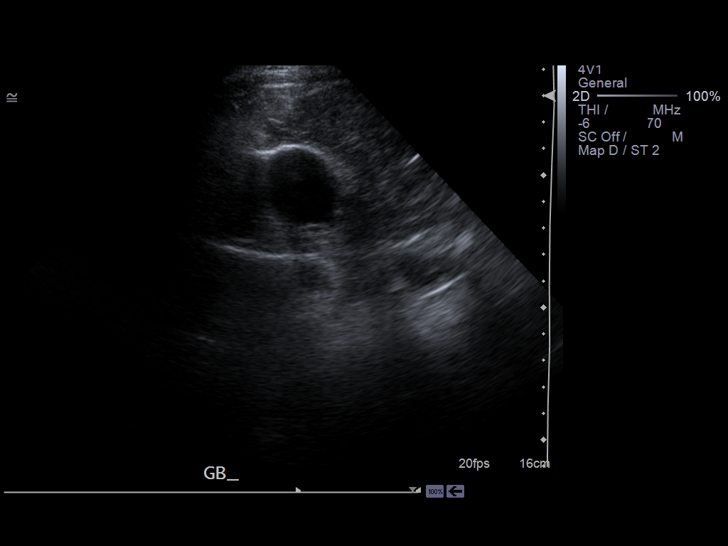

[14 of 25 positions shown; findings below may reference images not displayed]

FINDINGS: The liver is increased in echogenicity as can be seen with hepatic
steatosis. The liver is without evidence of a focal hepatic lesion.

There is no cholelithiasis or biliary sludge. There is no intra- or
extrahepatic biliary ductal dilatation. The common duct measures 3.9 mm in
maximal diameter. There is no gallbladder wall thickening, pericholecystic
fluid, or sonographic Murphy's sign.

The pancreas is non-visualized. The spleen is non-visualized. Bilateral
kidneys are normal in echogenicity and size. The right kidney measures 9.2 x
4.9 x 5.1 cm. The left kidney measures 11.5 x 5.1 x 5.5 cm. There are no
renal calculi or hydronephrosis. The abdominal aorta and IVC are
unremarkable.
IMPRESSION: 1. No cholelithiasis.

2. Hepatic steatosis.

[REDACTED]

## 2014-09-23 DIAGNOSIS — M9903 Segmental and somatic dysfunction of lumbar region: Secondary | ICD-10-CM | POA: Diagnosis not present

## 2014-09-23 DIAGNOSIS — M9902 Segmental and somatic dysfunction of thoracic region: Secondary | ICD-10-CM | POA: Diagnosis not present

## 2014-09-23 DIAGNOSIS — M5441 Lumbago with sciatica, right side: Secondary | ICD-10-CM | POA: Diagnosis not present

## 2014-09-23 DIAGNOSIS — M531 Cervicobrachial syndrome: Secondary | ICD-10-CM | POA: Diagnosis not present

## 2014-09-23 DIAGNOSIS — M5414 Radiculopathy, thoracic region: Secondary | ICD-10-CM | POA: Diagnosis not present

## 2014-09-23 DIAGNOSIS — M9901 Segmental and somatic dysfunction of cervical region: Secondary | ICD-10-CM | POA: Diagnosis not present

## 2014-09-23 DIAGNOSIS — M5442 Lumbago with sciatica, left side: Secondary | ICD-10-CM | POA: Diagnosis not present

## 2014-09-23 DIAGNOSIS — M4322 Fusion of spine, cervical region: Secondary | ICD-10-CM | POA: Diagnosis not present

## 2014-09-27 DIAGNOSIS — M25552 Pain in left hip: Secondary | ICD-10-CM | POA: Diagnosis not present

## 2014-09-27 DIAGNOSIS — M1612 Unilateral primary osteoarthritis, left hip: Secondary | ICD-10-CM | POA: Diagnosis not present

## 2014-10-10 ENCOUNTER — Ambulatory Visit (INDEPENDENT_AMBULATORY_CARE_PROVIDER_SITE_OTHER): Payer: Medicare Other | Admitting: Internal Medicine

## 2014-10-10 ENCOUNTER — Encounter: Payer: Self-pay | Admitting: Internal Medicine

## 2014-10-10 VITALS — BP 124/72 | HR 65 | Temp 98.5°F | Resp 16 | Ht 68.0 in | Wt 151.2 lb

## 2014-10-10 DIAGNOSIS — N400 Enlarged prostate without lower urinary tract symptoms: Secondary | ICD-10-CM | POA: Diagnosis not present

## 2014-10-10 DIAGNOSIS — Z125 Encounter for screening for malignant neoplasm of prostate: Secondary | ICD-10-CM

## 2014-10-10 DIAGNOSIS — M25552 Pain in left hip: Secondary | ICD-10-CM

## 2014-10-10 DIAGNOSIS — F329 Major depressive disorder, single episode, unspecified: Secondary | ICD-10-CM

## 2014-10-10 DIAGNOSIS — I10 Essential (primary) hypertension: Secondary | ICD-10-CM | POA: Diagnosis not present

## 2014-10-10 DIAGNOSIS — J0121 Acute recurrent ethmoidal sinusitis: Secondary | ICD-10-CM

## 2014-10-10 DIAGNOSIS — M25559 Pain in unspecified hip: Secondary | ICD-10-CM

## 2014-10-10 DIAGNOSIS — Z23 Encounter for immunization: Secondary | ICD-10-CM | POA: Diagnosis not present

## 2014-10-10 DIAGNOSIS — G8929 Other chronic pain: Secondary | ICD-10-CM

## 2014-10-10 DIAGNOSIS — Z0000 Encounter for general adult medical examination without abnormal findings: Secondary | ICD-10-CM

## 2014-10-10 DIAGNOSIS — F32A Depression, unspecified: Secondary | ICD-10-CM

## 2014-10-10 LAB — PSA, MEDICARE: PSA: 1.32 ng/ml (ref 0.10–4.00)

## 2014-10-10 MED ORDER — HYDROCODONE-ACETAMINOPHEN 5-325 MG PO TABS: 1.0000 | ORAL_TABLET | Freq: Four times a day (QID) | ORAL | Status: DC | PRN

## 2014-10-10 MED ORDER — FLUTICASONE PROPIONATE 50 MCG/ACT NA SUSP: 2.0000 | Freq: Every day | NASAL | Status: DC

## 2014-10-10 MED ORDER — AMOXICILLIN-POT CLAVULANATE 875-125 MG PO TABS: 1.0000 | ORAL_TABLET | Freq: Two times a day (BID) | ORAL | Status: DC

## 2014-10-10 MED ORDER — PREDNISONE (PAK) 10 MG PO TABS: ORAL_TABLET | ORAL | Status: DC

## 2014-10-10 MED ORDER — TETANUS-DIPHTH-ACELL PERTUSSIS 5-2.5-18.5 LF-MCG/0.5 IM SUSP: 0.5000 mL | Freq: Once | INTRAMUSCULAR | Status: DC

## 2014-10-10 NOTE — Assessment & Plan Note (Signed)
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  For hip replacement jan 27th by Dr. Marry Guan.  vicodin refilled.

## 2014-10-10 NOTE — Assessment & Plan Note (Signed)
Currently asymptomatic with SSRI therapy. Refills given.

## 2014-10-10 NOTE — Progress Notes (Signed)
Patient ID: James Logan, male   DOB: 1946/01/17, 68 y.o.   MRN: [id number]    The patient is here for annual Medicare wellness examination and management of other chronic and acute problems, including hypertension and depression.   He checks his BP at home once a week and the readings are consistently 130/82 or lower.   Persistent sinus congestion with a constant frontal headache  For the past 3 weeks.  He has a history of seasonal  rhinitis every fall,  Had viral bronchitis recently,  2 months ago,  Has not cleared up, still coughing up yellow phlegm and sneezing a lot. Cough has improved with mucinex D .  In the past he has tried Zyrtec which helped a little,  Has tried allegra d as well     Hooten is going to replace his  left hip in january for severe DJD  Jan 27th.  He has not followed up with Urology since Oct 2014 for nodular prostate due to concurrent neurosurgical issues and underwent cervical spine fusion in nov 2014 by Dr Gillie Manners.  He has been exercising rigourously regularly to regain the strength in his arms.     The risk factors are reflected in the social history.  The roster of all physicians providing medical care to patient - is listed in the Snapshot section of the chart.  Activities of daily living:  The patient is 100% independent in all ADLs: dressing, toileting, feeding as well as independent mobility  Home safety : The patient has smoke detectors in the home. They wear seatbelts.  There are no firearms at home. There is no violence in the home.   There is no risks for hepatitis, STDs or HIV. There is no   history of blood transfusion. They have no travel history to infectious disease endemic areas of the world.  The patient has seen their dentist in the last six month. They have seen their eye doctor in the last year. They admit to slight hearing difficulty with regard to whispered voices and some television programs.  They have deferred audiologic testing in the  last year.  They do not  have excessive sun exposure. Discussed the need for sun protection: hats, long sleeves and use of sunscreen if there is significant sun exposure.   Diet: the importance of a healthy diet is discussed. They do have a healthy diet.  The benefits of regular aerobic exercise were discussed. She walks 4 times per week ,  20 minutes.   Depression screen: there are no signs or vegative symptoms of depression- irritability, change in appetite, anhedonia, sadness/tearfullness.  Cognitive assessment: the patient manages all their financial and personal affairs and is actively engaged. They could relate day,date,year and events; recalled 2/3 objects at 3 minutes; performed clock-face test normally.  The following portions of the patient's history were reviewed and updated as appropriate: allergies, current medications, past family history, past medical history,  past surgical history, past social history  and problem list.  Visual acuity was not assessed per patient preference since she has regular follow up with her ophthalmologist. Hearing and body mass index were assessed and reviewed.   During the course of the visit the patient was educated and counseled about appropriate screening and preventive services including : fall prevention , diabetes screening, nutrition counseling, colorectal cancer screening, and recommended immunizations.    Objective:  BP 124/72 mmHg  Pulse 65  Temp(Src) 98.5 F (36.9 C) (Oral)  Resp 16  Ht  5\' 8"  (1.727 m)  Wt 151 lb 4 oz (68.607 kg)  BMI 23.00 kg/m2  SpO2 96%  General Appearance:    Alert, cooperative, no distress, appears stated age  Head:    Normocephalic, without obvious abnormality, atraumatic  Eyes:    PERRL, conjunctiva/corneas clear, EOM's intact, fundi    benign, both eyes       Ears:    Normal TM's and external ear canals, both ears  Nose:   Nares normal, septum midline, mucosa normal, no drainage   or sinus tenderness   Throat:   Lips, mucosa, and tongue normal; teeth and gums normal  Neck:   Supple, symmetrical, trachea midline, no adenopathy;       thyroid:  No enlargement/tenderness/nodules; no carotid   bruit or JVD  Back:     Symmetric, no curvature, ROM normal, no CVA tenderness  Lungs:     Clear to auscultation bilaterally, respirations unlabored  Chest wall:    No tenderness or deformity  Heart:    Regular rate and rhythm, S1 and S2 normal, no murmur, rub   or gallop  Abdomen:     Soft, non-tender, bowel sounds active all four quadrants,    no masses, no organomegaly  Genitalia:     Deferred to urology   Rectal:    Deferred  Extremities:   Extremities normal, atraumatic, no cyanosis or edema  Pulses:   2+ and symmetric all extremities  Skin:   Skin color, texture, turgor normal, no rashes or lesions  Lymph nodes:   Cervical, supraclavicular, and axillary nodes normal  Neurologic:   CNII-XII intact. Normal strength, sensation and reflexes      throughout   Assessment and Plan:  Essential hypertension, benign Well controlled on current regimen. Renal function stable, no changes today.  Lab Results  Component Value Date   CREATININE 1.1 09/08/2014   Lab Results  Component Value Date   NA 139 09/08/2014   K 4.8 09/08/2014   CL 104 09/08/2014   CO2 28 09/08/2014     Acute ethmoidal sinusitis Given chronicity of symptoms, development of facial pain and exam consistent with bacterial URI,  Will treat with empiric antibiotics, decongestants, and saline lavage.  Adding steroid nasal spray if not already taking.   Prostate enlargement Refer back to Dr cope after his hip surgery .  PSA today   Medicare annual wellness visit, subsequent Annual Medicare wellness  exam was done as well as a comprehensive physical exam and management of acute and chronic conditions .  During the course of the visit the patient was educated and counseled about appropriate screening and preventive services  including : fall prevention , diabetes screening, nutrition counseling, colorectal cancer screening, and recommended immunizations.  Printed recommendations for health maintenance screenings was given.   Depression Currently asymptomatic with SSRI therapy. Refills given.    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  For hip replacement jan 27th by Dr. Marry Guan.  vicodin refilled.    Updated Medication List Outpatient Encounter Prescriptions as of 10/10/2014  Medication Sig  . amLODipine (NORVASC) 5 MG tablet Take 1 tablet (5 mg total) by mouth daily.  [name] Ketotifen Fumarate (ALAWAY OP) Place 1 drop into both eyes daily as needed.  . meloxicam (MOBIC) 15 MG tablet Take 1 tablet (15 mg total) by mouth daily.  [name] omeprazole (PRILOSEC) 20 MG capsule Take 20 mg by mouth as needed.   [name] amoxicillin-clavulanate (AUGMENTIN) 875-125 MG per tablet Take 1 tablet by  mouth 2 (two) times daily.  . cyclobenzaprine (FLEXERIL) 10 MG tablet Take 1 tablet (10 mg total) by mouth 3 (three) times daily as needed for muscle spasms. (Patient not taking: Reported on 10/10/2014)  . fluticasone (FLONASE) 50 MCG/ACT nasal spray Place 2 sprays into both nostrils daily.  [name] HYDROcodone-acetaminophen (NORCO/VICODIN) 5-325 MG per tablet Take 1-2 tablets by mouth every 6 (six) hours as needed for moderate pain.  . predniSONE (STERAPRED UNI-PAK) 10 MG tablet 6 tablets on Day 1 , then reduce by 1 tablet daily until gone  . Tdap (BOOSTRIX) 5-2.5-18.5 LF-MCG/0.5 injection Inject 0.5 mLs into the muscle once.  . [DISCONTINUED] azelastine (ASTELIN) 137 MCG/SPRAY nasal spray Place 1 spray into both nostrils 2 (two) times daily. Use in each nostril as directed (Patient not taking: Reported on 10/10/2014)  . [DISCONTINUED] HYDROcodone-acetaminophen (NORCO/VICODIN) 5-325 MG per tablet Take 1-2 tablets by mouth every 6 (six) hours as needed for moderate pain. (Patient not taking: Reported on 10/10/2014)

## 2014-10-10 NOTE — Assessment & Plan Note (Signed)

## 2014-10-10 NOTE — Patient Instructions (Addendum)
You had your annual Medicare wellness exam today  You need to have a TDaP vaccine.   I have given you prescription for this  because they will be cheaper at the health Dept or at your  local pharmacy because Medicare will not reimburse for them.   You received the pneumonia vaccine today.  We will contact you with the PSA results   I am treating you for sinusitis/otitis which is a complication from your viral infection due to  persistent sinus congestion.   I am prescribing an antibiotic (augmentin) and a prednisone taper  To manage the infection and the inflammation in your ear/sinuses.   I also advise use of the following OTC meds to help with your other symptoms.   flush your sinuses twice daily with Simply Saline (do over the sink because if you do it right you will spit out globs of mucus)  Health Maintenance A healthy lifestyle and preventative care can promote health and wellness.  Maintain regular health, dental, and eye exams.  Eat a healthy diet. Foods like vegetables, fruits, whole grains, low-fat dairy products, and lean protein foods contain the nutrients you need and are low in calories. Decrease your intake of foods high in solid fats, added sugars, and salt. Get information about a proper diet from your health care provider, if necessary.  Regular physical exercise is one of the most important things you can do for your health. Most adults should get at least 150 minutes of moderate-intensity exercise (any activity that increases your heart rate and causes you to sweat) each week. In addition, most adults need muscle-strengthening exercises on 2 or more days a week.   Maintain a healthy weight. The body mass index (BMI) is a screening tool to identify possible weight problems. It provides an estimate of body fat based on height and weight. Your health care provider can find your BMI and can help you achieve or maintain a healthy weight. For males 20 years and older:  A  BMI below 18.5 is considered underweight.  A BMI of 18.5 to 24.9 is normal.  A BMI of 25 to 29.9 is considered overweight.  A BMI of 30 and above is considered obese.  Maintain normal blood lipids and cholesterol by exercising and minimizing your intake of saturated fat. Eat a balanced diet with plenty of fruits and vegetables. Blood tests for lipids and cholesterol should begin at age 69 and be repeated every 5 years. If your lipid or cholesterol levels are high, you are over age 2, or you are at high risk for heart disease, you may need your cholesterol levels checked more frequently.Ongoing high lipid and cholesterol levels should be treated with medicines if diet and exercise are not working.  If you smoke, find out from your health care provider how to quit. If you do not use tobacco, do not start.  Lung cancer screening is recommended for adults aged 16-80 years who are at high risk for developing lung cancer because of a history of smoking. A yearly low-dose CT scan of the lungs is recommended for people who have at least a 30-pack-year history of smoking and are current smokers or have quit within the past 15 years. A pack year of smoking is smoking an average of 1 pack of cigarettes a day for 1 year (for example, a 30-pack-year history of smoking could mean smoking 1 pack a day for 30 years or 2 packs a day for 15 years). Yearly screening should  continue until the smoker has stopped smoking for at least 15 years. Yearly screening should be stopped for people who develop a health problem that would prevent them from having lung cancer treatment.  If you choose to drink alcohol, do not have more than 2 drinks per day. One drink is considered to be 12 oz (360 mL) of beer, 5 oz (150 mL) of wine, or 1.5 oz (45 mL) of liquor.  Avoid the use of street drugs. Do not share needles with anyone. Ask for help if you need support or instructions about stopping the use of drugs.  High blood pressure  causes heart disease and increases the risk of stroke. Blood pressure should be checked at least every 1-2 years. Ongoing high blood pressure should be treated with medicines if weight loss and exercise are not effective.  If you are 4-6 years old, ask your health care provider if you should take aspirin to prevent heart disease.  Diabetes screening involves taking a blood sample to check your fasting blood sugar level. This should be done once every 3 years after age 60 if you are at a normal weight and without risk factors for diabetes. Testing should be considered at a younger age or be carried out more frequently if you are overweight and have at least 1 risk factor for diabetes.  Colorectal cancer can be detected and often prevented. Most routine colorectal cancer screening begins at the age of 85 and continues through age 11. However, your health care provider may recommend screening at an earlier age if you have risk factors for colon cancer. On a yearly basis, your health care provider may provide home test kits to check for hidden blood in the stool. A small camera at the end of a tube may be used to directly examine the colon (sigmoidoscopy or colonoscopy) to detect the earliest forms of colorectal cancer. Talk to your health care provider about this at age 58 when routine screening begins. A direct exam of the colon should be repeated every 5-10 years through age 35, unless early forms of precancerous polyps or small growths are found.  People who are at an increased risk for hepatitis B should be screened for this virus. You are considered at high risk for hepatitis B if:  You were born in a country where hepatitis B occurs often. Talk with your health care provider about which countries are considered high risk.  Your parents were born in a high-risk country and you have not received a shot to protect against hepatitis B (hepatitis B vaccine).  You have HIV or AIDS.  You use needles to  inject street drugs.  You live with, or have sex with, someone who has hepatitis B.  You are a man who has sex with other men (MSM).  You get hemodialysis treatment.  You take certain medicines for conditions like cancer, organ transplantation, and autoimmune conditions.  Hepatitis C blood testing is recommended for all people born from [birth year] through 1966 and any individual with known risk factors for hepatitis C.  Healthy men should no longer receive prostate-specific antigen (PSA) blood tests as part of routine cancer screening. Talk to your health care provider about prostate cancer screening.  Testicular cancer screening is not recommended for adolescents or adult males who have no symptoms. Screening includes self-exam, a health care provider exam, and other screening tests. Consult with your health care provider about any symptoms you have or any concerns you have about testicular cancer.  Practice safe sex. Use condoms and avoid high-risk sexual practices to reduce the spread of sexually transmitted infections (STIs).  You should be screened for STIs, including gonorrhea and chlamydia if:  You are sexually active and are younger than 24 years.  You are older than 24 years, and your health care provider tells you that you are at risk for this type of infection.  Your sexual activity has changed since you were last screened, and you are at an increased risk for chlamydia or gonorrhea. Ask your health care provider if you are at risk.  If you are at risk of being infected with HIV, it is recommended that you take a prescription medicine daily to prevent HIV infection. This is called pre-exposure prophylaxis (PrEP). You are considered at risk if:  You are a man who has sex with other men (MSM).  You are a heterosexual man who is sexually active with multiple partners.  You take drugs by injection.  You are sexually active with a partner who has HIV.  Talk with your health care  provider about whether you are at high risk of being infected with HIV. If you choose to begin PrEP, you should first be tested for HIV. You should then be tested every 3 months for as long as you are taking PrEP.  Use sunscreen. Apply sunscreen liberally and repeatedly throughout the day. You should seek shade when your shadow is shorter than you. Protect yourself by wearing long sleeves, pants, a wide-brimmed hat, and sunglasses year round whenever you are outdoors.  Tell your health care provider of new moles or changes in moles, especially if there is a change in shape or color. Also, tell your health care provider if a mole is larger than the size of a pencil eraser.  A one-time screening for abdominal aortic aneurysm (AAA) and surgical repair of large AAAs by ultrasound is recommended for men aged 75-75 years who are current or former smokers.  Stay current with your vaccines (immunizations). Document Released: 04/18/2008 Document Revised: 10/26/2013 Document Reviewed: 03/18/2011 [facility] Patient Information 2015 [place], [place]. This information is not intended to replace advice given to you by your health care provider. Make sure you discuss any questions you have with your health care provider.

## 2014-10-10 NOTE — Assessment & Plan Note (Signed)
Given chronicity of symptoms, development of facial pain and exam consistent with bacterial URI,  Will treat with empiric antibiotics, decongestants, and saline lavage.  Adding steroid nasal spray if not already taking.  °

## 2014-10-10 NOTE — Assessment & Plan Note (Signed)
Well controlled on current regimen. Renal function stable, no changes today.  Lab Results  Component Value Date   CREATININE 1.1 09/08/2014   Lab Results  Component Value Date   NA 139 09/08/2014   K 4.8 09/08/2014   CL 104 09/08/2014   CO2 28 09/08/2014

## 2014-10-10 NOTE — Assessment & Plan Note (Signed)
Refer back to Dr cope after his hip surgery .  PSA today

## 2014-10-10 NOTE — Progress Notes (Signed)
Pre-visit discussion using our clinic review tool. No additional management support is needed unless otherwise documented below in the visit note.  

## 2014-10-11 ENCOUNTER — Telehealth: Payer: Self-pay | Admitting: Internal Medicine

## 2014-10-11 NOTE — Telephone Encounter (Signed)
emmi emailed °

## 2014-10-11 NOTE — Addendum Note (Signed)
Addended by: Crecencio Mc on: 10/11/2014 12:10 PM   Modules accepted: Level of Service

## 2014-10-12 ENCOUNTER — Encounter: Payer: Self-pay | Admitting: Internal Medicine

## 2014-10-19 DIAGNOSIS — M5414 Radiculopathy, thoracic region: Secondary | ICD-10-CM | POA: Diagnosis not present

## 2014-10-19 DIAGNOSIS — M5442 Lumbago with sciatica, left side: Secondary | ICD-10-CM | POA: Diagnosis not present

## 2014-10-19 DIAGNOSIS — M4322 Fusion of spine, cervical region: Secondary | ICD-10-CM | POA: Diagnosis not present

## 2014-10-19 DIAGNOSIS — M9903 Segmental and somatic dysfunction of lumbar region: Secondary | ICD-10-CM | POA: Diagnosis not present

## 2014-10-19 DIAGNOSIS — M5441 Lumbago with sciatica, right side: Secondary | ICD-10-CM | POA: Diagnosis not present

## 2014-10-19 DIAGNOSIS — M9901 Segmental and somatic dysfunction of cervical region: Secondary | ICD-10-CM | POA: Diagnosis not present

## 2014-10-19 DIAGNOSIS — M531 Cervicobrachial syndrome: Secondary | ICD-10-CM | POA: Diagnosis not present

## 2014-10-19 DIAGNOSIS — M9902 Segmental and somatic dysfunction of thoracic region: Secondary | ICD-10-CM | POA: Diagnosis not present

## 2014-10-25 ENCOUNTER — Other Ambulatory Visit: Payer: Self-pay | Admitting: Internal Medicine

## 2014-11-16 ENCOUNTER — Ambulatory Visit: Payer: Self-pay | Admitting: General Practice

## 2014-11-16 DIAGNOSIS — J45909 Unspecified asthma, uncomplicated: Secondary | ICD-10-CM | POA: Diagnosis not present

## 2014-11-16 DIAGNOSIS — Z7982 Long term (current) use of aspirin: Secondary | ICD-10-CM | POA: Diagnosis not present

## 2014-11-16 DIAGNOSIS — Z01810 Encounter for preprocedural cardiovascular examination: Secondary | ICD-10-CM | POA: Diagnosis not present

## 2014-11-16 DIAGNOSIS — Z87891 Personal history of nicotine dependence: Secondary | ICD-10-CM | POA: Diagnosis not present

## 2014-11-16 DIAGNOSIS — K219 Gastro-esophageal reflux disease without esophagitis: Secondary | ICD-10-CM | POA: Diagnosis not present

## 2014-11-16 DIAGNOSIS — Z01812 Encounter for preprocedural laboratory examination: Secondary | ICD-10-CM | POA: Diagnosis not present

## 2014-11-16 DIAGNOSIS — L309 Dermatitis, unspecified: Secondary | ICD-10-CM | POA: Diagnosis not present

## 2014-11-16 DIAGNOSIS — I10 Essential (primary) hypertension: Secondary | ICD-10-CM | POA: Diagnosis not present

## 2014-11-16 DIAGNOSIS — M1612 Unilateral primary osteoarthritis, left hip: Secondary | ICD-10-CM | POA: Diagnosis not present

## 2014-11-16 LAB — URINALYSIS, COMPLETE
BILIRUBIN, UR: NEGATIVE
Bacteria: NONE SEEN
Blood: NEGATIVE
Glucose,UR: NEGATIVE mg/dL (ref 0–75)
KETONE: NEGATIVE
Leukocyte Esterase: NEGATIVE
Nitrite: NEGATIVE
Ph: 6 (ref 4.5–8.0)
Protein: NEGATIVE
RBC,UR: NONE SEEN /HPF (ref 0–5)
Specific Gravity: 1.005 (ref 1.003–1.030)
Squamous Epithelial: NONE SEEN
WBC UR: 1 /HPF (ref 0–5)

## 2014-11-16 LAB — APTT: Activated PTT: 29.9 secs (ref 23.6–35.9)

## 2014-11-16 LAB — SEDIMENTATION RATE: Erythrocyte Sed Rate: 1 mm/hr (ref 0–20)

## 2014-11-16 LAB — PROTIME-INR
INR: 1
PROTHROMBIN TIME: 13.3 s (ref 11.5–14.7)

## 2014-11-16 LAB — BASIC METABOLIC PANEL
ANION GAP: 7 (ref 7–16)
BUN: 11 mg/dL (ref 7–18)
CHLORIDE: 105 mmol/L (ref 98–107)
CREATININE: 0.93 mg/dL (ref 0.60–1.30)
Calcium, Total: 8.7 mg/dL (ref 8.5–10.1)
Co2: 26 mmol/L (ref 21–32)
EGFR (African American): >60
EGFR (Non-African Amer.): >60
Glucose: 89 mg/dL (ref 65–99)
Osmolality: 275 (ref 275–301)
Potassium: 4.2 mmol/L (ref 3.5–5.1)
SODIUM: 138 mmol/L (ref 136–145)

## 2014-11-16 LAB — CBC
HCT: 50.8 % (ref 40.0–52.0)
HGB: 17 g/dL (ref 13.0–18.0)
MCH: 33.5 pg (ref 26.0–34.0)
MCHC: 33.5 g/dL (ref 32.0–36.0)
MCV: 100 fL (ref 80–100)
PLATELETS: 290 10*3/uL (ref 150–440)
RBC: 5.07 10*6/uL (ref 4.40–5.90)
RDW: 13.8 % (ref 11.5–14.5)
WBC: 7.8 10*3/uL (ref 3.8–10.6)

## 2014-11-16 LAB — MRSA PCR SCREENING

## 2014-11-18 LAB — URINE CULTURE

## 2014-11-30 ENCOUNTER — Inpatient Hospital Stay: Payer: Self-pay | Admitting: General Practice

## 2014-11-30 DIAGNOSIS — G4730 Sleep apnea, unspecified: Secondary | ICD-10-CM | POA: Diagnosis present

## 2014-11-30 DIAGNOSIS — J45909 Unspecified asthma, uncomplicated: Secondary | ICD-10-CM | POA: Diagnosis present

## 2014-11-30 DIAGNOSIS — Z471 Aftercare following joint replacement surgery: Secondary | ICD-10-CM | POA: Diagnosis not present

## 2014-11-30 DIAGNOSIS — M1612 Unilateral primary osteoarthritis, left hip: Secondary | ICD-10-CM | POA: Diagnosis not present

## 2014-11-30 DIAGNOSIS — K219 Gastro-esophageal reflux disease without esophagitis: Secondary | ICD-10-CM | POA: Diagnosis present

## 2014-11-30 DIAGNOSIS — I10 Essential (primary) hypertension: Secondary | ICD-10-CM | POA: Diagnosis present

## 2014-11-30 DIAGNOSIS — L309 Dermatitis, unspecified: Secondary | ICD-10-CM | POA: Diagnosis present

## 2014-11-30 DIAGNOSIS — Z96642 Presence of left artificial hip joint: Secondary | ICD-10-CM | POA: Diagnosis not present

## 2014-11-30 DIAGNOSIS — Z79899 Other long term (current) drug therapy: Secondary | ICD-10-CM | POA: Diagnosis not present

## 2014-11-30 DIAGNOSIS — Z791 Long term (current) use of non-steroidal anti-inflammatories (NSAID): Secondary | ICD-10-CM | POA: Diagnosis not present

## 2014-11-30 HISTORY — PX: TOTAL HIP ARTHROPLASTY: SHX124

## 2014-12-01 LAB — BASIC METABOLIC PANEL
ANION GAP: 7 (ref 7–16)
BUN: 12 mg/dL (ref 7–18)
CHLORIDE: 106 mmol/L (ref 98–107)
CO2: 26 mmol/L (ref 21–32)
Calcium, Total: 7.8 mg/dL — ABNORMAL LOW (ref 8.5–10.1)
Creatinine: 1.06 mg/dL (ref 0.60–1.30)
EGFR (Non-African Amer.): >60
Glucose: 106 mg/dL — ABNORMAL HIGH (ref 65–99)
OSMOLALITY: 278 (ref 275–301)
Potassium: 4.1 mmol/L (ref 3.5–5.1)
Sodium: 139 mmol/L (ref 136–145)

## 2014-12-01 LAB — HEMOGLOBIN: HGB: 13 g/dL (ref 13.0–18.0)

## 2014-12-01 LAB — PLATELET COUNT: PLATELETS: 242 10*3/uL (ref 150–440)

## 2014-12-02 LAB — BASIC METABOLIC PANEL
Anion Gap: 6 — ABNORMAL LOW (ref 7–16)
BUN: 10 mg/dL (ref 7–18)
CREATININE: 0.86 mg/dL (ref 0.60–1.30)
Calcium, Total: 8.4 mg/dL — ABNORMAL LOW (ref 8.5–10.1)
Chloride: 104 mmol/L (ref 98–107)
Co2: 27 mmol/L (ref 21–32)
EGFR (African American): >60
EGFR (Non-African Amer.): >60
Glucose: 105 mg/dL — ABNORMAL HIGH (ref 65–99)
Osmolality: 273 (ref 275–301)
POTASSIUM: 4.4 mmol/L (ref 3.5–5.1)
Sodium: 137 mmol/L (ref 136–145)

## 2014-12-02 LAB — HEMOGLOBIN: HGB: 13 g/dL (ref 13.0–18.0)

## 2014-12-02 LAB — PLATELET COUNT: Platelet: 248 10*3/uL (ref 150–440)

## 2014-12-04 DIAGNOSIS — Z96642 Presence of left artificial hip joint: Secondary | ICD-10-CM | POA: Diagnosis not present

## 2014-12-04 DIAGNOSIS — Z471 Aftercare following joint replacement surgery: Secondary | ICD-10-CM | POA: Diagnosis not present

## 2014-12-04 DIAGNOSIS — Z4802 Encounter for removal of sutures: Secondary | ICD-10-CM | POA: Diagnosis not present

## 2014-12-05 DIAGNOSIS — Z96642 Presence of left artificial hip joint: Secondary | ICD-10-CM | POA: Diagnosis not present

## 2014-12-05 DIAGNOSIS — Z471 Aftercare following joint replacement surgery: Secondary | ICD-10-CM | POA: Diagnosis not present

## 2014-12-05 DIAGNOSIS — Z4802 Encounter for removal of sutures: Secondary | ICD-10-CM | POA: Diagnosis not present

## 2014-12-07 DIAGNOSIS — Z4802 Encounter for removal of sutures: Secondary | ICD-10-CM | POA: Diagnosis not present

## 2014-12-07 DIAGNOSIS — Z471 Aftercare following joint replacement surgery: Secondary | ICD-10-CM | POA: Diagnosis not present

## 2014-12-07 DIAGNOSIS — Z96642 Presence of left artificial hip joint: Secondary | ICD-10-CM | POA: Diagnosis not present

## 2014-12-09 DIAGNOSIS — Z471 Aftercare following joint replacement surgery: Secondary | ICD-10-CM | POA: Diagnosis not present

## 2014-12-09 DIAGNOSIS — Z4802 Encounter for removal of sutures: Secondary | ICD-10-CM | POA: Diagnosis not present

## 2014-12-09 DIAGNOSIS — Z96642 Presence of left artificial hip joint: Secondary | ICD-10-CM | POA: Diagnosis not present

## 2014-12-12 DIAGNOSIS — Z96642 Presence of left artificial hip joint: Secondary | ICD-10-CM | POA: Diagnosis not present

## 2014-12-12 DIAGNOSIS — Z4802 Encounter for removal of sutures: Secondary | ICD-10-CM | POA: Diagnosis not present

## 2014-12-12 DIAGNOSIS — Z471 Aftercare following joint replacement surgery: Secondary | ICD-10-CM | POA: Diagnosis not present

## 2014-12-15 DIAGNOSIS — Z96642 Presence of left artificial hip joint: Secondary | ICD-10-CM | POA: Diagnosis not present

## 2014-12-15 DIAGNOSIS — Z471 Aftercare following joint replacement surgery: Secondary | ICD-10-CM | POA: Diagnosis not present

## 2014-12-15 DIAGNOSIS — Z4802 Encounter for removal of sutures: Secondary | ICD-10-CM | POA: Diagnosis not present

## 2014-12-19 DIAGNOSIS — Z471 Aftercare following joint replacement surgery: Secondary | ICD-10-CM | POA: Diagnosis not present

## 2014-12-19 DIAGNOSIS — Z96642 Presence of left artificial hip joint: Secondary | ICD-10-CM | POA: Diagnosis not present

## 2014-12-19 DIAGNOSIS — Z4802 Encounter for removal of sutures: Secondary | ICD-10-CM | POA: Diagnosis not present

## 2014-12-21 DIAGNOSIS — Z4802 Encounter for removal of sutures: Secondary | ICD-10-CM | POA: Diagnosis not present

## 2014-12-21 DIAGNOSIS — Z96642 Presence of left artificial hip joint: Secondary | ICD-10-CM | POA: Diagnosis not present

## 2014-12-21 DIAGNOSIS — Z471 Aftercare following joint replacement surgery: Secondary | ICD-10-CM | POA: Diagnosis not present

## 2014-12-26 DIAGNOSIS — Z4802 Encounter for removal of sutures: Secondary | ICD-10-CM | POA: Diagnosis not present

## 2014-12-26 DIAGNOSIS — Z96642 Presence of left artificial hip joint: Secondary | ICD-10-CM | POA: Diagnosis not present

## 2014-12-26 DIAGNOSIS — Z471 Aftercare following joint replacement surgery: Secondary | ICD-10-CM | POA: Diagnosis not present

## 2014-12-29 DIAGNOSIS — Z96642 Presence of left artificial hip joint: Secondary | ICD-10-CM | POA: Diagnosis not present

## 2014-12-29 DIAGNOSIS — Z4802 Encounter for removal of sutures: Secondary | ICD-10-CM | POA: Diagnosis not present

## 2014-12-29 DIAGNOSIS — Z471 Aftercare following joint replacement surgery: Secondary | ICD-10-CM | POA: Diagnosis not present

## 2015-01-01 DIAGNOSIS — Z471 Aftercare following joint replacement surgery: Secondary | ICD-10-CM | POA: Diagnosis not present

## 2015-01-01 DIAGNOSIS — Z96642 Presence of left artificial hip joint: Secondary | ICD-10-CM | POA: Diagnosis not present

## 2015-01-04 DIAGNOSIS — Z4802 Encounter for removal of sutures: Secondary | ICD-10-CM | POA: Diagnosis not present

## 2015-01-04 DIAGNOSIS — Z471 Aftercare following joint replacement surgery: Secondary | ICD-10-CM | POA: Diagnosis not present

## 2015-01-04 DIAGNOSIS — Z96642 Presence of left artificial hip joint: Secondary | ICD-10-CM | POA: Diagnosis not present

## 2015-01-10 DIAGNOSIS — Z96642 Presence of left artificial hip joint: Secondary | ICD-10-CM | POA: Diagnosis not present

## 2015-01-10 DIAGNOSIS — Z471 Aftercare following joint replacement surgery: Secondary | ICD-10-CM | POA: Diagnosis not present

## 2015-01-10 DIAGNOSIS — Z4802 Encounter for removal of sutures: Secondary | ICD-10-CM | POA: Diagnosis not present

## 2015-01-12 DIAGNOSIS — Z96642 Presence of left artificial hip joint: Secondary | ICD-10-CM | POA: Diagnosis not present

## 2015-01-15 DIAGNOSIS — Z96649 Presence of unspecified artificial hip joint: Secondary | ICD-10-CM | POA: Insufficient documentation

## 2015-02-23 DIAGNOSIS — Z96642 Presence of left artificial hip joint: Secondary | ICD-10-CM | POA: Diagnosis not present

## 2015-02-27 LAB — SURGICAL PATHOLOGY

## 2015-03-05 NOTE — Op Note (Signed)
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   PROCEDURE PERFORMED: Left total hip arthroplasty.   SURGEON: Laurice Record. Holley Bouche., MD   ASSISTANT: Vance Peper, PA (required to maintain retraction throughout the procedure)   ANESTHESIA: Spinal.   ESTIMATED BLOOD LOSS: 250 mL.   FLUIDS REPLACED: 1100 mL of crystalloid.   DRAINS: Two medium drains to Hemovac reservoir.   IMPLANTS UTILIZED: DePuy 13.5 mm small stature AML femoral stem, 50 mm outer diameter Pinnacle 100 acetabular shell, +4 mm neutral Pinnacle Marathon polyethylene liner, and a 32 mm cobalt chrome hip ball with a +5 mm neck length.   INDICATIONS FOR SURGERY: The patient is a 69 year old male who has been seen for complaints of severe progressive left hip and groin pain. He had also noted significant decrease in his hip range of motion. X-rays demonstrated severe degenerative changes with prominent osteophytes and essentially bone-on-bone articulation. After discussion of the risks and benefits of surgical intervention, the patient expressed understanding of the risks and benefits, and agreed with plans for surgical intervention.   PROCEDURE IN DETAIL: The patient was brought to the operating room and, after adequate spinal anesthesia was achieved, the patient was placed in the right lateral decubitus position. Axillary roll was placed and all bony prominences were well padded. The patient's left hip and leg were cleaned and prepped with alcohol and DuraPrep and draped in the usual sterile fashion. A "timeout" was performed as per usual protocol. A lateral curvilinear incision was made, gently curving towards the posterior superior iliac spine. IT band was incised in line with the skin incision. Fibers of the gluteus maximus  were split in line. Dissection was carried down to the piriformis tendon and the tendon was skeletonized, incised at its insertion to the proximal femur and reflected posteriorly. In a similar fashion, short external rotators were incised and reflected posteriorly. A T-type posterior capsulotomy was performed. Prior to dislocation of the femoral head, a threaded Steinmann pin was inserted through a separate stab incision into the pelvis, superior to the acetabulum and bent in the form of a stylus, so as to assess limb length and hip offset throughout the procedure. The femoral head was then dislocated posteriorly. Inspection of the femoral head demonstrated severe degenerative changes with full-thickness loss of articular cartilage and prominent osteophytes. The femoral neck cut was performed using an oscillating saw. The anterior capsule was elevated off the femoral neck. Inspection of the acetabulum also demonstrated severe degenerative changes with periacetabular osteophytes. The acetabulum was reamed in a sequential fashion up to a 49 mm diameter. This allowed for excellent punctate bleeding bone. The remnant of the labrum had been excised using electrocautery. A 50 mm outer diameter Pinnacle 100 acetabular component was positioned and impacted into place. Excellent scratch fit was appreciated. A +4 mm neutral polyethylene trial was inserted and attention was directed to the proximal femur after debridement of the periacetabular osteophytes. A pilot hole for reaming of the proximal femoral canal was created using a high-speed bur. The proximal femoral canal was reamed in a sequential fashion up to a 13 mm diameter. Serial broaches were inserted up to a 13.5 mm small stature broach. The calcar region was planed and trial reduction was performed using, first, a +1 and subsequently a +5 mm neck length. This  allowed for good equalization of limb lengths and hip offset. Excellent stability was appreciated, both  anteriorly and posteriorly. Trials were removed. The acetabular shell was irrigated with copious amounts of normal saline with antibiotic solution and then suctioned dry. A +4 mm neutral Pinnacle Marathon polyethylene insert was positioned and impacted into place. Next, the 13.5 mm small stature AML femoral component was initially seated. Due to the amount of scratch fit, it was elected to ream line to line to a 13.5 mm diameter by hand. The stem was repositioned and impacted into place. Excellent scratch fit was appreciated. A trial reduction was performed with a 32 mm hip ball with a +5 mm neck length. Again, good equalization of limb lengths and hip offset was appreciated and excellent stability appreciated, both anteriorly and posteriorly. Trial hip ball was removed. The Morse taper was cleaned and dried. A 32 mm cobalt chrome hip ball with a +5 mm neck length was placed on the trunnion and impacted into place. The hip was then reduced with good equalization of limb lengths and hip offset, as well as excellent stability, again noted.   The wound was irrigated with copious amounts of normal saline with antibiotic solution, using pulsatile lavage and then suctioned dry. Good hemostasis was appreciated. The posterior capsulotomy was repaired using a #5 Ethibond. The piriformis tendon was reapproximated on the surface of the gluteus medius tendon using #5 Ethibond. Two medium drains were placed in the wound bed brought out through a separate stab incision to be attached to a Hemovac reservoir. IT band was repaired using interrupted sutures of #1 Vicryl. The subcutaneous tissue was approximated in layers using first #0 Vicryl followed by #2-0 Vicryl. Skin was closed with skin staples. Sterile dressing was applied. The patient tolerated the procedure well. He was transported to the recovery room in stable condition.     ____________________________ Laurice Record. Holley Bouche., MD [email or web address] D: 12/01/2014 06:19:09  ET T: 12/01/2014 11:04:41 ET JOB#: [id number]  cc: Jeneen Rinks P. Holley Bouche., MD, <Dictator> Laurice Record Holley Bouche MD ELECTRONICALLY SIGNED 12/02/2014 19:06

## 2015-03-05 NOTE — Discharge Summary (Signed)
PATIENT NAME:  James Logan, James Logan MR#:  [id number] DATE OF BIRTH:  [birth year]  DATE OF ADMISSION:  11/30/2014 DATE OF DISCHARGE:  12/03/2014  ADMITTING DIAGNOSIS: Degenerative arthrosis of the left hip.   DISCHARGE DIAGNOSIS: Degenerative arthrosis of the left hip.   OPERATION: The patient had a left total hip arthroplasty.   SURGEON: Skip Estimable, M.D.   ASSISTANT: Vance Peper, PA.   ANESTHESIA: Spinal.   ESTIMATED BLOOD LOSS: 250 mL   IMPLANTS USED: A DePuy 13.5 mm small stature AML femoral stem, 50 mm outer diameter pedicle, 100 acetabular shell, +4 mm neutral Pinnacle Marathon polyethylene liner, 32 mm cobalt chrome hip ball with +5 mm neck length. Patient was stabilized, brought to the recovery room and then brought down to the orthopedic floor, where he was treated for pain control and physical therapy.   HISTORY AND PHYSICAL: The patient is a 69 year old male, who presented for an upcoming total hip replacement. The patient has been refractory to conservative treatment and had difficulty with activities of daily living, including weight bearing activities and was continuing to have significant stiffness, although he has used ambulatory aids and anti-inflammatories.   PHYSICAL EXAMINATION:  GENERAL: Alert male with an antalgic gait, showing no evidence of abductor lurch.  LUNGS: Clear to auscultation.  CARDIOVASCULAR: Regular rate and rhythm.  MUSCULOSKELETAL: In regard to the left hip, the patient has equal leg lengths. The patient has no edema. The patient is tender on the greater trochanter along the anterior aspect of the hip joint. The patient has full extension to 100 degrees of flexion and adduction of 20 degrees with internal rotation of 20 degrees and external rotation of 20 degrees.   HOSPITAL COURSE: After initial admission on 11/29/2014, and the patient was brought to the orthopedic floor. On postoperative day 1, the patient's hemoglobin was at 13.0, which remained stable  there. The patient worked with physical therapy, initially ambulating in the room, progressing up to 240 feet, including stairs on the day before discharge. The patient is ready to go home with home health physical therapy.   DISCHARGE INSTRUCTIONS: The patient will follow up with [facility] orthopedics on 01/12/2015. The patient will do weight bear as tolerated. The patient elevate his leg with 1 to 2 pillows and use knee-high TED hose on both legs, removed at nighttime. The patient will elevate his heels off the bed and use incentive spirometer. The patient was encouraged to do cough and deep breathing. The patient will do a regular diet. The patient will keep his dressing clean and dry and only have it changed as needed with physical therapy. The patient will have the staples removed on 12/14/2014 with physical therapy. The patient will call the clinic if there is any bright red bleeding, calf pain, bowel or bladder difficulty, or any fever greater than 101.5. The patient will do physical therapy per protocol.   DISCHARGE MEDICATIONS: Resume home medication and to add tramadol 50 mg 1 to 2 tablets every 4 hours as needed for mild to moderate pain, oxycodone 5 mg 1 to 2 tablets every four 4 hours as needed for more severe pain, and Lovenox 30 mg every 12 hours as needed for deep vein thrombosis prophylaxis. He will do this for 14 days, then discontinue and start on the aspirin 81 mg once a day.   ____________________________ Lenna Sciara. Reche Dixon, [place] [email or web address] D: 12/03/2014 07:31:40 ET T: 12/03/2014 17:25:40 ET JOB#: [id number]  cc: J. Reche Dixon, PA, <Dictator> J Simisola Sandles [facility] PA  ELECTRONICALLY SIGNED 12/07/2014 7:39

## 2015-04-12 ENCOUNTER — Encounter: Payer: Self-pay | Admitting: Internal Medicine

## 2015-04-12 ENCOUNTER — Ambulatory Visit (INDEPENDENT_AMBULATORY_CARE_PROVIDER_SITE_OTHER): Payer: Medicare Other | Admitting: Internal Medicine

## 2015-04-12 VITALS — BP 140/80 | HR 57 | Temp 97.8°F | Resp 18 | Ht 68.0 in | Wt 154.8 lb

## 2015-04-12 DIAGNOSIS — IMO0001 Reserved for inherently not codable concepts without codable children: Secondary | ICD-10-CM

## 2015-04-12 DIAGNOSIS — I10 Essential (primary) hypertension: Secondary | ICD-10-CM

## 2015-04-12 DIAGNOSIS — R030 Elevated blood-pressure reading, without diagnosis of hypertension: Secondary | ICD-10-CM

## 2015-04-12 LAB — COMPREHENSIVE METABOLIC PANEL
ALBUMIN: 4.3 g/dL (ref 3.5–5.2)
ALK PHOS: 67 U/L (ref 39–117)
ALT: 18 U/L (ref 0–53)
AST: 26 U/L (ref 0–37)
BUN: 17 mg/dL (ref 6–23)
CALCIUM: 9.5 mg/dL (ref 8.4–10.5)
CO2: 28 meq/L (ref 19–32)
Chloride: 103 mEq/L (ref 96–112)
Creatinine, Ser: 0.93 mg/dL (ref 0.40–1.50)
GFR: 85.51 mL/min (ref >60.00)
Glucose, Bld: 95 mg/dL (ref 70–99)
Potassium: 4.4 mEq/L (ref 3.5–5.1)
Sodium: 137 mEq/L (ref 135–145)
Total Bilirubin: 0.6 mg/dL (ref 0.2–1.2)
Total Protein: 7.3 g/dL (ref 6.0–8.3)

## 2015-04-12 LAB — MICROALBUMIN / CREATININE URINE RATIO
Creatinine,U: 24.6 mg/dL
Microalb Creat Ratio: 2.8 mg/g (ref 0.0–30.0)
Microalb, Ur: <0.7 mg/dL (ref 0.0–1.9)

## 2015-04-12 MED ORDER — MELOXICAM 15 MG PO TABS: 15.0000 mg | ORAL_TABLET | Freq: Every day | ORAL | Status: DC

## 2015-04-12 NOTE — Progress Notes (Signed)
Pre visit review using our clinic review tool, if applicable. No additional management support is needed unless otherwise documented below in the visit note. 

## 2015-04-12 NOTE — Progress Notes (Signed)
Subjective:  Patient ID: James Logan, male    DOB: 05-Nov-1945  Age: 69 y.o. MRN: [id number]  CC: The primary encounter diagnosis was Elevated blood pressure. A diagnosis of Essential hypertension, benign was also pertinent to this visit.  HPI James Logan presents for follow up on hypertension.  He has not been his medications because he has been checking his BP at home and states his measurements are the same whether he takes his medication or not. bps have been 130/74  never higher than 140/80  Outpatient Prescriptions Prior to Visit  Medication Sig Dispense Refill  . cyclobenzaprine (FLEXERIL) 10 MG tablet Take 1 tablet (10 mg total) by mouth 3 (three) times daily as needed for muscle spasms. (Patient not taking: Reported on 10/10/2014) 60 tablet 0  . fluticasone (FLONASE) 50 MCG/ACT nasal spray Place 2 sprays into both nostrils daily. 16 g 2  . HYDROcodone-acetaminophen (NORCO/VICODIN) 5-325 MG per tablet Take 1-2 tablets by mouth every 6 (six) hours as needed for moderate pain. 60 tablet 0  . Ketotifen Fumarate (ALAWAY OP) Place 1 drop into both eyes daily as needed.    [name] omeprazole (PRILOSEC) 20 MG capsule Take 20 mg by mouth as needed.     . Tdap (BOOSTRIX) 5-2.5-18.5 LF-MCG/0.5 injection Inject 0.5 mLs into the muscle once. 0.5 mL 0  . amLODipine (NORVASC) 5 MG tablet Take 1 tablet (5 mg total) by mouth daily. 90 tablet 3  . amoxicillin-clavulanate (AUGMENTIN) 875-125 MG per tablet Take 1 tablet by mouth 2 (two) times daily. (Patient not taking: Reported on 04/12/2015) 14 tablet 0  . meloxicam (MOBIC) 15 MG tablet TAKE 1 TABLET BY MOUTH ONCE DAILY. 30 tablet 2  . predniSONE (STERAPRED UNI-PAK) 10 MG tablet 6 tablets on Day 1 , then reduce by 1 tablet daily until gone (Patient not taking: Reported on 04/12/2015) 21 tablet 0   No facility-administered medications prior to visit.    Review of Systems;  Patient denies headache, fevers, malaise, unintentional weight loss, skin rash,  eye pain, sinus congestion and sinus pain, sore throat, dysphagia,  hemoptysis , cough, dyspnea, wheezing, chest pain, palpitations, orthopnea, edema, abdominal pain, nausea, melena, diarrhea, constipation, flank pain, dysuria, hematuria, urinary  Frequency, nocturia, numbness, tingling, seizures,  Focal weakness, Loss of consciousness,  Tremor, insomnia, depression, anxiety, and suicidal ideation.      Objective:  BP 140/80 mmHg  Pulse 57  Temp(Src) 97.8 F (36.6 C)  Resp 18  Ht 5\' 8"  (1.727 m)  Wt 154 lb 12.8 oz (70.217 kg)  BMI 23.54 kg/m2  SpO2 96%  BP Readings from Last 3 Encounters:  04/12/15 140/80  10/10/14 124/72  09/18/13 122/75    Wt Readings from Last 3 Encounters:  04/12/15 154 lb 12.8 oz (70.217 kg)  10/10/14 151 lb 4 oz (68.607 kg)  09/13/13 169 lb 8 oz (76.885 kg)    General appearance: alert, cooperative and appears stated age Ears: normal TM's and external ear canals both ears Throat: lips, mucosa, and tongue normal; teeth and gums normal Neck: no adenopathy, no carotid bruit, supple, symmetrical, trachea midline and thyroid not enlarged, symmetric, no tenderness/mass/nodules Back: symmetric, no curvature. ROM normal. No CVA tenderness. Lungs: clear to auscultation bilaterally Heart: regular rate and rhythm, S1, S2 normal, no murmur, click, rub or gallop Abdomen: soft, non-tender; bowel sounds normal; no masses,  no organomegaly Pulses: 2+ and symmetric Skin: Skin color, texture, turgor normal. No rashes or lesions Lymph nodes: Cervical, supraclavicular, and axillary nodes  normal.  No results found for: HGBA1C  Lab Results  Component Value Date   CREATININE 0.93 04/12/2015   CREATININE 0.86 12/02/2014   CREATININE 1.06 12/01/2014    Lab Results  Component Value Date   WBC 7.8 11/16/2014   HGB 13.0 12/02/2014   HCT 50.8 11/16/2014   PLT 248 12/02/2014   GLUCOSE 95 04/12/2015   CHOL 187 09/08/2014   TRIG 153.0* 09/08/2014   HDL 34.10*  09/08/2014   LDLDIRECT 146.8 07/12/2013   LDLCALC 122* 09/08/2014   ALT 18 04/12/2015   AST 26 04/12/2015   NA 137 04/12/2015   K 4.4 04/12/2015   CL 103 04/12/2015   CREATININE 0.93 04/12/2015   BUN 17 04/12/2015   CO2 28 04/12/2015   TSH 2.55 09/08/2014   PSA 1.32 10/10/2014   INR 1.0 11/16/2014   MICROALBUR <0.7 04/12/2015    No results found.  Assessment & Plan:   Problem List Items Addressed This Visit    Essential hypertension, benign    Agree with suspending medication and continuing weekly or monthly surveillance at home. Will resume medication when BP  Is persistently > 140/80.  , unless he has proteinuria or elevated cr today on labs.   Lab Results  Component Value Date   CREATININE 0.93 04/12/2015   Lab Results  Component Value Date   MICROALBUR <0.7 04/12/2015          Other Visit Diagnoses    Elevated blood pressure    -  Primary    Relevant Orders    Comprehensive metabolic panel (Completed)    Microalbumin / creatinine urine ratio (Completed)       I have discontinued Mr. Riggan's amLODipine, predniSONE, and amoxicillin-clavulanate. I have also changed his meloxicam. Additionally, I am having him maintain his omeprazole, Ketotifen Fumarate (ALAWAY OP), cyclobenzaprine, fluticasone, Tdap, and HYDROcodone-acetaminophen.  Meds ordered this encounter  Medications  . meloxicam (MOBIC) 15 MG tablet    Sig: Take 1 tablet (15 mg total) by mouth daily.    Dispense:  30 tablet    Refill:  2    Medications Discontinued During This Encounter  Medication Reason  . meloxicam (MOBIC) 15 MG tablet Reorder  . predniSONE (STERAPRED UNI-PAK) 10 MG tablet   . amLODipine (NORVASC) 5 MG tablet   . amoxicillin-clavulanate (AUGMENTIN) 875-125 MG per tablet     Follow-up: No Follow-up on file.   Crecencio Mc, MD

## 2015-04-12 NOTE — Patient Instructions (Signed)
Your home blood pressure readings are relatively normal )Goal is BP 130/80)  If your labs today are normal.  You can discontinue the BP medication.  If they are abnormal  We will resume medication .  If you home readings begin to persistently be > 140/80, we will start medications regardless of the bloodwork.

## 2015-04-14 ENCOUNTER — Encounter: Payer: Self-pay | Admitting: Internal Medicine

## 2015-04-14 NOTE — Assessment & Plan Note (Signed)
Agree with suspending medication and continuing weekly or monthly surveillance at home. Will resume medication when BP  Is persistently > 140/80.  , unless he has proteinuria or elevated cr today on labs.   Lab Results  Component Value Date   CREATININE 0.93 04/12/2015   Lab Results  Component Value Date   MICROALBUR <0.7 04/12/2015

## 2015-04-15 ENCOUNTER — Encounter: Payer: Self-pay | Admitting: Internal Medicine

## 2015-05-25 DIAGNOSIS — Z96642 Presence of left artificial hip joint: Secondary | ICD-10-CM | POA: Diagnosis not present

## 2015-08-04 DIAGNOSIS — Z23 Encounter for immunization: Secondary | ICD-10-CM | POA: Diagnosis not present

## 2015-09-10 IMAGING — CR ORBITS FOR FOREIGN BODY - 2 VIEW
1 series · 3 of 3 positions shown · non-contrast
Comparison: none

REASON FOR EXAM: Hx of metal in eye- Screen for residual fragments prior
to MRI
COMMENTS:

[Series 1: w waters pa · 0.14mm/px · 3 of 3 slices shown]
[im 1/3]
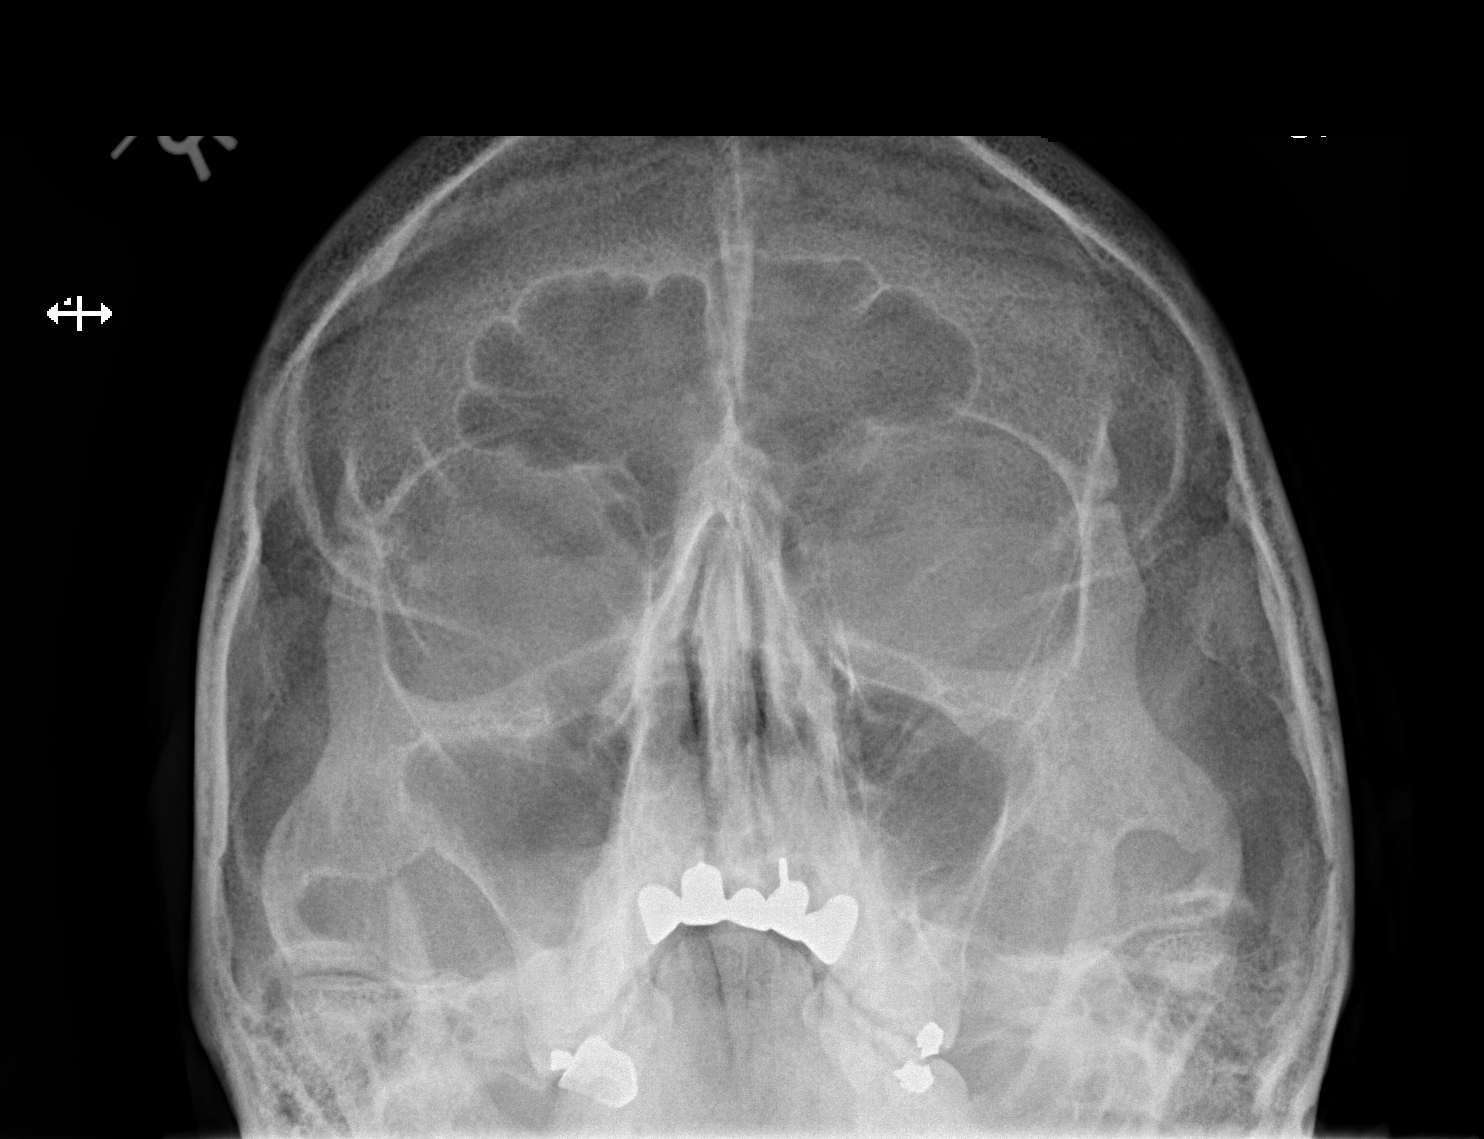
[im 2/3]
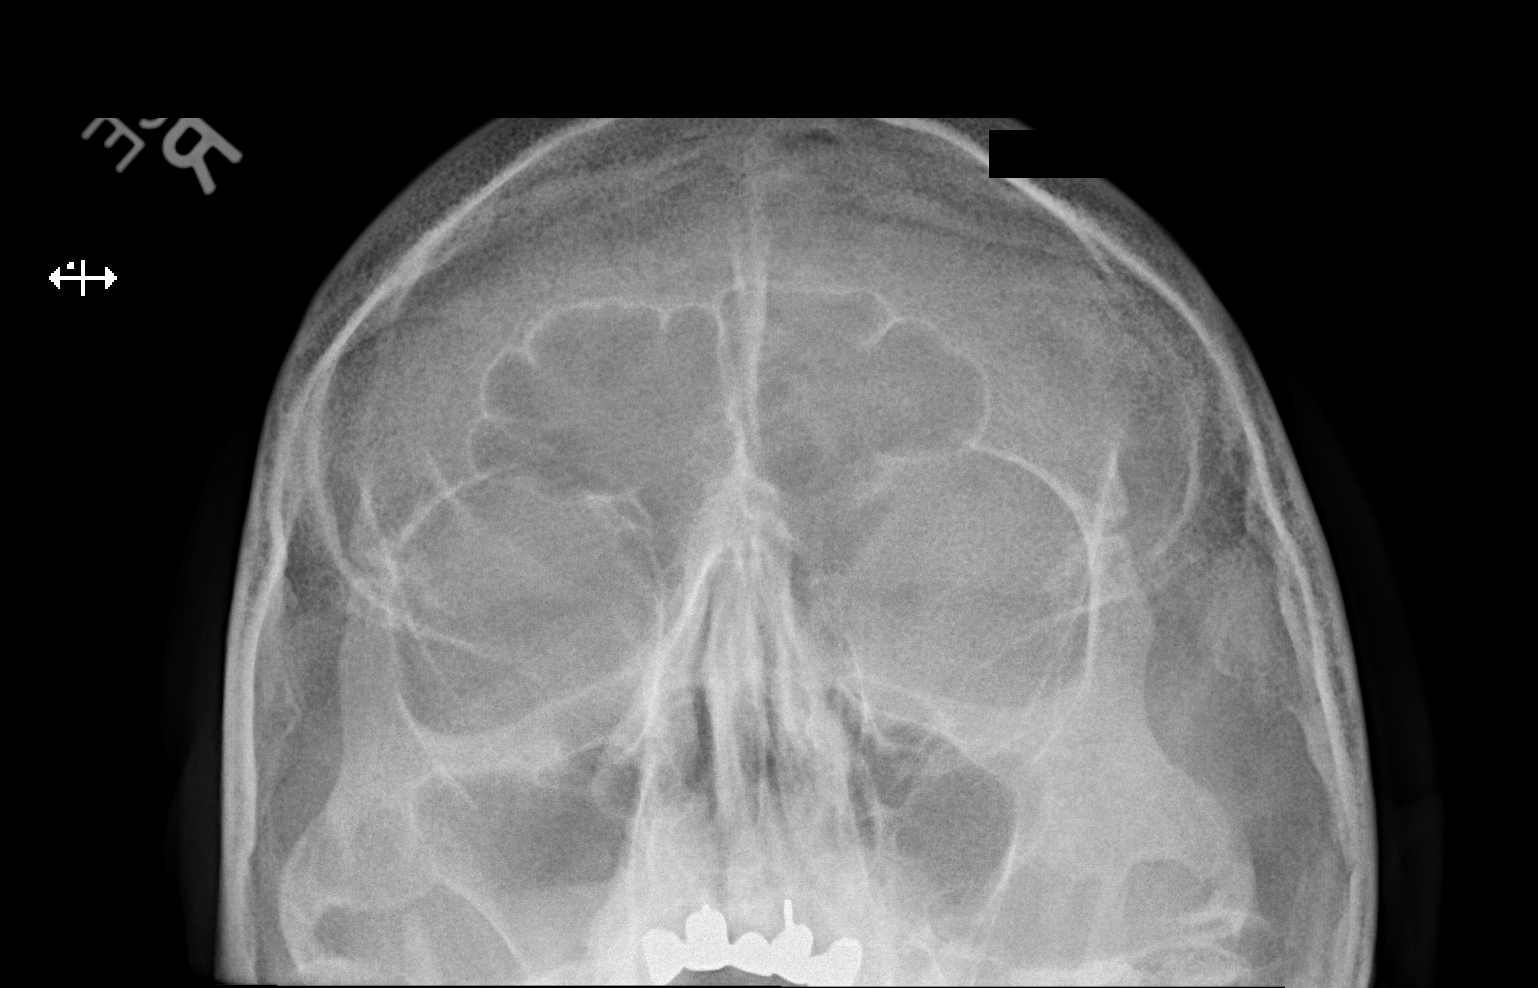
[im 3/3]
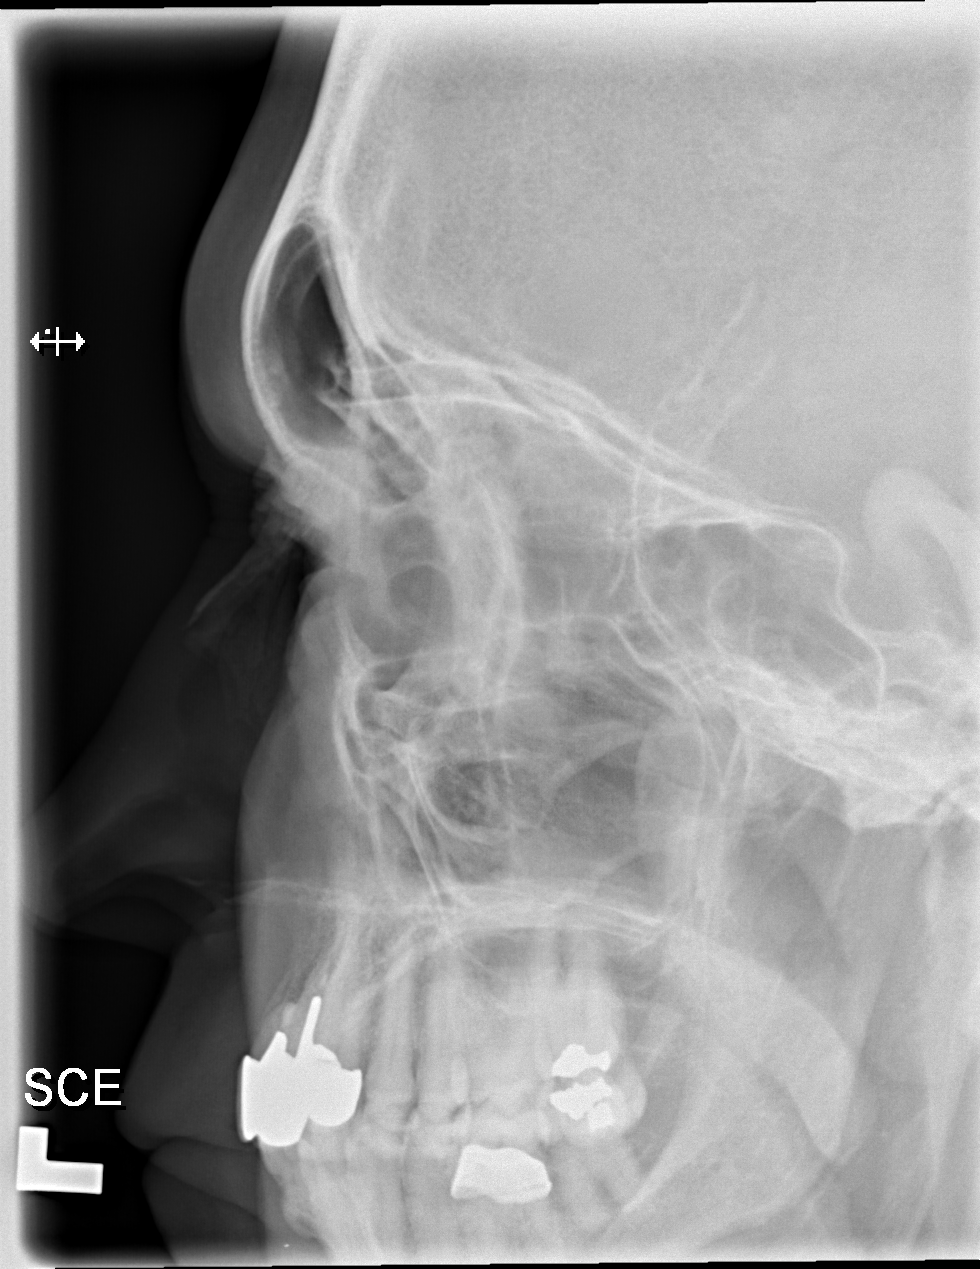

[3 of 3 positions shown; findings below may reference images not displayed]

PROCEDURE:     DXR - DXR ORBITS FOR MRI CLEARANCE  - July 20, 2013  [DATE]

RESULT:     Images of the orbits are obtained prior to MRI to evaluate for
possible retained metallic foreign body per the patient's history. Upward
and downward gaze in Rudi views of the lateral view show no evidence of
retained metallic opacity.
IMPRESSION: 1. No findings to suggest retained metallic orbital foreign body.

[REDACTED]

## 2015-09-20 DIAGNOSIS — H25813 Combined forms of age-related cataract, bilateral: Secondary | ICD-10-CM | POA: Diagnosis not present

## 2015-10-13 ENCOUNTER — Ambulatory Visit (INDEPENDENT_AMBULATORY_CARE_PROVIDER_SITE_OTHER): Payer: Medicare Other | Admitting: Internal Medicine

## 2015-10-13 ENCOUNTER — Encounter: Payer: Self-pay | Admitting: Internal Medicine

## 2015-10-13 VITALS — BP 174/90 | HR 57 | Temp 97.8°F | Resp 12 | Ht 68.0 in | Wt 157.8 lb

## 2015-10-13 DIAGNOSIS — I10 Essential (primary) hypertension: Secondary | ICD-10-CM

## 2015-10-13 DIAGNOSIS — E785 Hyperlipidemia, unspecified: Secondary | ICD-10-CM

## 2015-10-13 DIAGNOSIS — S46811A Strain of other muscles, fascia and tendons at shoulder and upper arm level, right arm, initial encounter: Secondary | ICD-10-CM

## 2015-10-13 DIAGNOSIS — G8929 Other chronic pain: Secondary | ICD-10-CM

## 2015-10-13 DIAGNOSIS — M25552 Pain in left hip: Secondary | ICD-10-CM

## 2015-10-13 DIAGNOSIS — Z96642 Presence of left artificial hip joint: Secondary | ICD-10-CM

## 2015-10-13 DIAGNOSIS — N403 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: Secondary | ICD-10-CM

## 2015-10-13 DIAGNOSIS — Z79899 Other long term (current) drug therapy: Secondary | ICD-10-CM

## 2015-10-13 LAB — COMPREHENSIVE METABOLIC PANEL
ALK PHOS: 54 U/L (ref 39–117)
ALT: 20 U/L (ref 0–53)
AST: 27 U/L (ref 0–37)
Albumin: 4.3 g/dL (ref 3.5–5.2)
BILIRUBIN TOTAL: 0.8 mg/dL (ref 0.2–1.2)
BUN: 20 mg/dL (ref 6–23)
CALCIUM: 9.3 mg/dL (ref 8.4–10.5)
CHLORIDE: 104 meq/L (ref 96–112)
CO2: 27 mEq/L (ref 19–32)
Creatinine, Ser: 0.96 mg/dL (ref 0.40–1.50)
GFR: 82.31 mL/min (ref >60.00)
Glucose, Bld: 99 mg/dL (ref 70–99)
Potassium: 4.5 mEq/L (ref 3.5–5.1)
Sodium: 139 mEq/L (ref 135–145)
Total Protein: 7.2 g/dL (ref 6.0–8.3)

## 2015-10-13 LAB — LIPID PANEL
Cholesterol: 196 mg/dL (ref 0–200)
HDL: 41.9 mg/dL (ref >39.00)
LDL Cholesterol: 130 mg/dL — ABNORMAL HIGH (ref 0–99)
NonHDL: 154.56
Total CHOL/HDL Ratio: 5
Triglycerides: 124 mg/dL (ref 0.0–149.0)
VLDL: 24.8 mg/dL (ref 0.0–40.0)

## 2015-10-13 MED ORDER — MELOXICAM 15 MG PO TABS: 15.0000 mg | ORAL_TABLET | Freq: Every day | ORAL | Status: DC

## 2015-10-13 MED ORDER — CYCLOBENZAPRINE HCL 10 MG PO TABS: 10.0000 mg | ORAL_TABLET | Freq: Three times a day (TID) | ORAL | Status: DC | PRN

## 2015-10-13 NOTE — Patient Instructions (Addendum)
Please check your BP 5 times over the next month and send me the readings  In 1 month   I have refilled your meloxicam and your flexeril   I am setting you up with your new doctor Dr Lacinda Axon in 3 months

## 2015-10-13 NOTE — Progress Notes (Signed)
Pre-visit discussion using our clinic review tool. No additional management support is needed unless otherwise documented below in the visit note.  

## 2015-10-13 NOTE — Progress Notes (Addendum)
Subjective:  Patient ID: James Logan, male    DOB: June 26, 1946  Age: 69 y.o. MRN: [id number]  CC: The primary encounter diagnosis was Long-term use of high-risk medication. 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  HPI [name] presents for follow up on hypertension .  BP medication was stopped at annual exam in June when his home readings without the medication were < 130/80  .  Home blood pressures have been normal,  This week has  been very "stressed out" by a litigious situation that has threatened his and [name] financial situation .  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  Maintenance and the [employer] .  [name] Home bps have ranged form 118/60 to 142/80 and machine has been calibrated. [name]    Has been having right shoulder trapezius muscle spasm from doing some of the work at [employer].  Using the meloxicam and tylenol.  Not vicodin   He underwent left hip replacement in Jan 2016 by Dr. Marry Guan and has been walking and working wihtout pain .   He is requesting a switch to  a male doctor  With sports medicine interest Lacinda Axon).  .   Outpatient Prescriptions Prior to Visit  Medication Sig Dispense Refill  . fluticasone (FLONASE) 50 MCG/ACT nasal spray Place 2 sprays into both nostrils daily. 16 g 2  . Ketotifen Fumarate (ALAWAY OP) Place 1 drop into both eyes daily as needed.    . meloxicam (MOBIC) 15 MG tablet Take 1 tablet (15 mg total) by mouth daily. 30 tablet 2  . cyclobenzaprine (FLEXERIL) 10 MG tablet Take 1 tablet (10 mg total) by mouth 3 (three) times daily as needed for muscle spasms. (Patient not taking: Reported on 10/10/2014) 60 tablet 0  .  HYDROcodone-acetaminophen (NORCO/VICODIN) 5-325 MG per tablet Take 1-2 tablets by mouth every 6 (six) hours as needed for moderate pain. 60 tablet 0  . omeprazole (PRILOSEC) 20 MG capsule Take 20 mg by mouth as needed.     . Tdap (BOOSTRIX) 5-2.5-18.5 LF-MCG/0.5 injection Inject 0.5 mLs into the muscle once. 0.5 mL 0   No facility-administered medications prior to visit.    Review of Systems;  Patient denies headache, fevers, malaise, unintentional weight loss, skin rash, eye pain, sinus congestion and sinus pain, sore throat, dysphagia,  hemoptysis , cough, dyspnea, wheezing, chest pain, palpitations, orthopnea, edema, abdominal pain, nausea, melena, diarrhea, constipation, flank pain, dysuria, hematuria, urinary  Frequency, nocturia, numbness, tingling, seizures,  Focal weakness, Loss of consciousness,  Tremor, insomnia, depression, anxiety, and suicidal ideation.      Objective:  BP 174/90 mmHg  Pulse 57  Temp(Src) 97.8 F (36.6 C) (Oral)  Resp 12  Ht 5\' 8"  (1.727 m)  Wt 157 lb 12 oz (71.555 kg)  BMI 23.99 kg/m2  SpO2 98%  BP Readings from Last 3 Encounters:  10/13/15 174/90  04/12/15 140/80  10/10/14 124/72    Wt Readings from Last 3 Encounters:  10/13/15 157 lb 12 oz (71.555 kg)  04/12/15 154 lb 12.8 oz (70.217 kg)  10/10/14 151 lb 4 oz (68.607 kg)    General appearance: alert, cooperative and appears stated age Ears: normal TM's and external ear canals both ears Throat:  lips, mucosa, and tongue normal; teeth and gums normal Neck: no adenopathy, no carotid bruit, supple, symmetrical, trachea midline and thyroid not enlarged, symmetric, no tenderness/mass/nodules Back: symmetric, no curvature. ROM normal. No CVA tenderness. Lungs: clear to auscultation bilaterally Heart: regular rate and rhythm, S1, S2 normal, no murmur, click, rub or gallop Abdomen: soft, non-tender; bowel sounds normal; no masses,  no organomegaly Pulses: 2+ and symmetric Skin: Skin color, texture,  turgor normal. No rashes or lesions Lymph nodes: Cervical, supraclavicular, and axillary nodes normal.  No results found for: HGBA1C  Lab Results  Component Value Date   CREATININE 0.96 10/13/2015   CREATININE 0.93 04/12/2015   CREATININE 0.86 12/02/2014    Lab Results  Component Value Date   WBC 7.8 11/16/2014   HGB 13.0 12/02/2014   HCT 50.8 11/16/2014   PLT 248 12/02/2014   GLUCOSE 99 10/13/2015   CHOL 196 10/13/2015   TRIG 124.0 10/13/2015   HDL 41.90 10/13/2015   LDLDIRECT 146.8 07/12/2013   LDLCALC 130* 10/13/2015   ALT 20 10/13/2015   AST 27 10/13/2015   NA 139 10/13/2015   K 4.5 10/13/2015   CL 104 10/13/2015   CREATININE 0.96 10/13/2015   BUN 20 10/13/2015   CO2 27 10/13/2015   TSH 2.55 09/08/2014   PSA 1.32 10/10/2014   INR 1.0 11/16/2014   MICROALBUR <0.7 04/12/2015    No results found.  Assessment & Plan:   Problem List Items Addressed This Visit    Essential hypertension, benign    Currently controlled without medications.  Today's elevation is not characteristic of his home readings,  And his home BP machine has been calibrated.  No changes today . [name] He will check his blood pressure several times over the next 3-4 weeks and to submit readings for evaluation. Urine microalbumin to creatinine ratio done  In June was normal.  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  He has not seen hin or any other urologist since then and has not had a PSA since Dec 2015 .  Will schedule his annula with Dr Lacinda Axon, per patient request for male doctor.   And order PSA>      Hip pain, chronic    Resolved s/p left THR By [facility], jan 2016       Relevant Medications   meloxicam (MOBIC) 15 MG tablet   cyclobenzaprine (FLEXERIL) 10 MG tablet   History of total left hip arthroplasty    By [facility] Jan 2016  With good results       Trapezius muscle strain    Meloxicam, tylenol and flexeril.  Has not been using vicodin .      Hyperlipidemia    Indication for statin  therapy is dependent on history of hypertension.  Will have him discuss with new PCP at next visit   Lab Results  Component Value Date   CHOL 196 10/13/2015   HDL 41.90 10/13/2015   LDLCALC 130* 10/13/2015  LDLDIRECT 146.8 07/12/2013   TRIG 124.0 10/13/2015   CHOLHDL 5 10/13/2015         Relevant Orders   Lipid panel (Completed)    Other Visit Diagnoses    Long-term use of high-risk medication    -  Primary    Relevant Orders    Comprehensive metabolic panel (Completed)      A total of 25 minutes of face to face time was spent with patient more than half of which was spent in counselling and coordination of care    I have discontinued Mr. Dibari's omeprazole, Tdap, and HYDROcodone-acetaminophen. I am also having him maintain his Ketotifen Fumarate (ALAWAY OP), fluticasone, esomeprazole, meloxicam, and cyclobenzaprine.  Meds ordered this encounter  Medications  . esomeprazole (NEXIUM) 20 MG capsule    Sig: Take 20 mg by mouth daily at 12 noon.  . meloxicam (MOBIC) 15 MG tablet    Sig: Take 1 tablet (15 mg total) by mouth daily.    Dispense:  90 tablet    Refill:  1  . cyclobenzaprine (FLEXERIL) 10 MG tablet    Sig: Take 1 tablet (10 mg total) by mouth 3 (three) times daily as needed for muscle spasms.    Dispense:  60 tablet    Refill:  1    Medications Discontinued During This Encounter  Medication Reason  . Tdap (BOOSTRIX) 5-2.5-18.5 LF-MCG/0.5 injection Completed Course  . omeprazole (PRILOSEC) 20 MG capsule Completed Course  .  HYDROcodone-acetaminophen (NORCO/VICODIN) 5-325 MG per tablet Completed Course  . cyclobenzaprine (FLEXERIL) 10 MG tablet Reorder  . meloxicam (MOBIC) 15 MG tablet Reorder  . cyclobenzaprine (FLEXERIL) 10 MG tablet Reorder    Follow-up: Return in about 3 months (around 01/11/2016).   Crecencio Mc, MD

## 2015-10-15 ENCOUNTER — Encounter: Payer: Self-pay | Admitting: Internal Medicine

## 2015-10-15 DIAGNOSIS — S46819A Strain of other muscles, fascia and tendons at shoulder and upper arm level, unspecified arm, initial encounter: Secondary | ICD-10-CM | POA: Insufficient documentation

## 2015-10-15 DIAGNOSIS — Z96642 Presence of left artificial hip joint: Secondary | ICD-10-CM | POA: Insufficient documentation

## 2015-10-15 DIAGNOSIS — E785 Hyperlipidemia, unspecified: Secondary | ICD-10-CM | POA: Insufficient documentation

## 2015-10-15 NOTE — Assessment & Plan Note (Signed)
Indication for statin  therapy is dependent on history of hypertension.  Will have him discuss with new PCP at next visit   Lab Results  Component Value Date   CHOL 196 10/13/2015   HDL 41.90 10/13/2015   LDLCALC 130* 10/13/2015   LDLDIRECT 146.8 07/12/2013   TRIG 124.0 10/13/2015   CHOLHDL 5 10/13/2015

## 2015-10-15 NOTE — Assessment & Plan Note (Addendum)
Resolved s/p left THR By [facility], Mary Sella 2016

## 2015-10-15 NOTE — Assessment & Plan Note (Signed)
Meloxicam, tylenol and flexeril.  Has not been using vicodin .

## 2015-10-15 NOTE — Assessment & Plan Note (Addendum)
Currently controlled without medications.  Today's elevation is not characteristic of his home readings,  And his home BP machine has been calibrated.  No changes today . [name] He will check his blood pressure several times over the next 3-4 weeks and to submit readings for evaluation. Urine microalbumin to creatinine ratio done  In June was normal.  Lab Results  Component Value Date   CREATININE 0.96 10/13/2015   Lab Results  Component Value Date   NA 139 10/13/2015   K 4.5 10/13/2015   CL 104 10/13/2015   CO2 27 10/13/2015   Lab Results  Component Value Date   MICROALBUR <0.7 04/12/2015

## 2015-10-15 NOTE — Assessment & Plan Note (Signed)
By [facility] Jan 2016  With good results

## 2015-10-15 NOTE — Assessment & Plan Note (Signed)
HE ws referred to Urology in 2014,  Dr Jacqlyn Larsen advised him to have biopsy due to abnormal prostate exam but patient refused despite the potential for CA.  He has not seen hin or any other urologist since then and has not had a PSA since Dec 2015 .  Will schedule his annula with Dr Lacinda Axon, per patient request for male doctor.  And order PSA>

## 2015-11-01 NOTE — Telephone Encounter (Signed)
Mailed unread message to patient.  

## 2015-11-08 IMAGING — RF DG CERVICAL SPINE 1V
1 series · 1 of 1 positions shown · non-contrast
Comparison: MRI cervical spine 07/20/2013

CLINICAL DATA: C4-C6 ACDF

EXAM:
DG CERVICAL SPINE - 1 VIEW; DG C-ARM 1-60 MIN

[Series 1: run · 1 of 1 slices shown]
[im 1/1]
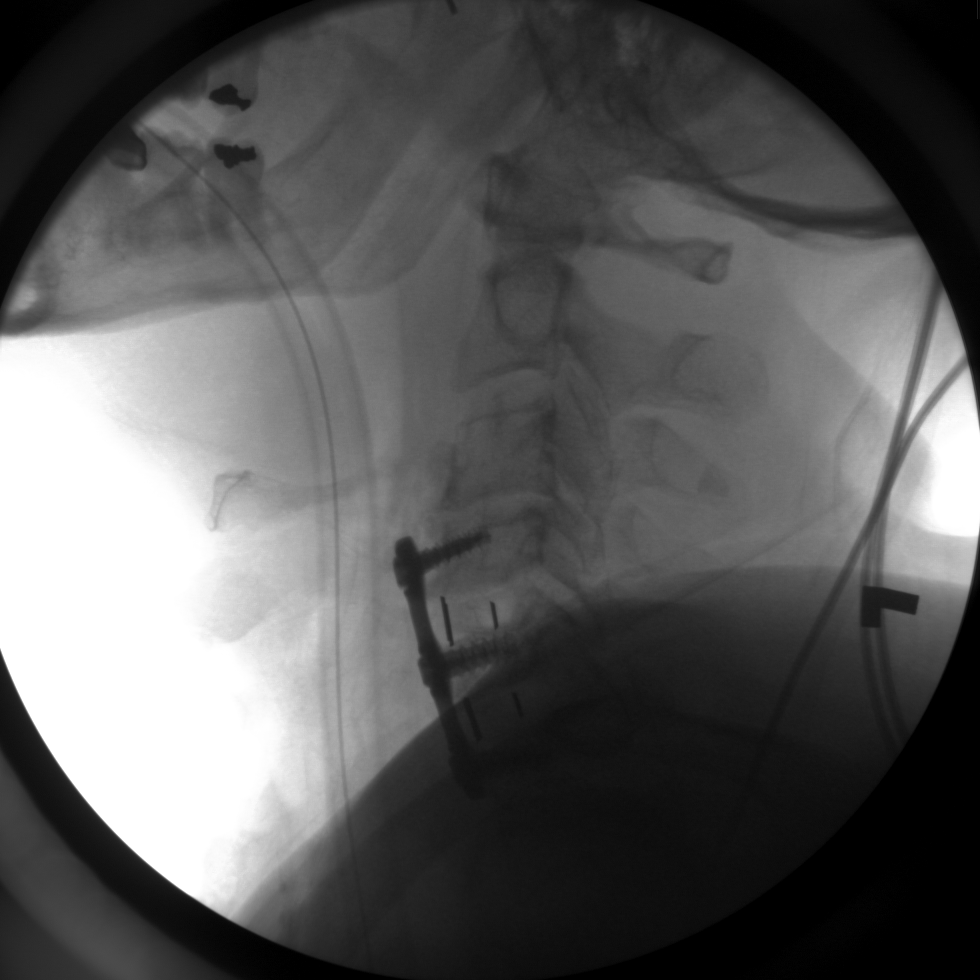

[1 of 1 positions shown; findings below may reference images not displayed]

FINDINGS: A single lateral intraoperative spot image demonstrates interval
anterior cervical discectomy and vertebral body plate and screw
fixation spanning C4-C6. Interbody grafts are noted at C4-C5 and
C5-C6. The patient is intubated. No acute hardware complication.
IMPRESSION: C4-C6 ACDF intraoperative image as above.

## 2015-11-28 DIAGNOSIS — Z96642 Presence of left artificial hip joint: Secondary | ICD-10-CM | POA: Diagnosis not present

## 2016-01-11 ENCOUNTER — Ambulatory Visit: Payer: BC Managed Care – PPO | Admitting: Family Medicine

## 2016-01-17 ENCOUNTER — Ambulatory Visit (INDEPENDENT_AMBULATORY_CARE_PROVIDER_SITE_OTHER): Payer: Medicare Other | Admitting: Family Medicine

## 2016-01-17 ENCOUNTER — Encounter: Payer: Self-pay | Admitting: Family Medicine

## 2016-01-17 VITALS — BP 116/72 | HR 71 | Temp 97.4°F | Ht 68.0 in | Wt 159.1 lb

## 2016-01-17 DIAGNOSIS — N529 Male erectile dysfunction, unspecified: Secondary | ICD-10-CM | POA: Diagnosis not present

## 2016-01-17 DIAGNOSIS — E785 Hyperlipidemia, unspecified: Secondary | ICD-10-CM

## 2016-01-17 DIAGNOSIS — N400 Enlarged prostate without lower urinary tract symptoms: Secondary | ICD-10-CM

## 2016-01-17 DIAGNOSIS — Z125 Encounter for screening for malignant neoplasm of prostate: Secondary | ICD-10-CM | POA: Diagnosis not present

## 2016-01-17 DIAGNOSIS — I10 Essential (primary) hypertension: Secondary | ICD-10-CM | POA: Diagnosis not present

## 2016-01-17 MED ORDER — SILDENAFIL CITRATE 50 MG PO TABS: 50.0000 mg | ORAL_TABLET | Freq: Every day | ORAL | Status: DC | PRN

## 2016-01-17 NOTE — Assessment & Plan Note (Signed)
Chronic. New problem to me. Likely organic in nature. Treating with Viagra.

## 2016-01-17 NOTE — Progress Notes (Signed)
Subjective:  Patient ID: James Logan, male    DOB: 1946/07/29  Age: 70 y.o. MRN: [id number]  CC: Establish care with me (switching PCP); ED, HLD, HTN, Hx of prostate issues  HPI James Logan is a 70 y.o. male presents to the clinic today to establish with me. Issues are below.  HTN  Well controlled.  Not on any treatment at this time.   HLD  Fairly well-controlled.  Last lipid panel did reveal a LDL of 1:30.  ASCVD risk score is 16.5%.  Will discuss treatment options today.  ED  Patient states that this is a long-standing problem.  He states that he is unable to achieve an erection.  He states that he still has significant sex drive.  He would like to discuss treatment options today.  He states that he was given Viagra in the past but could not afford it.  Prostate issues  Patient has a history of abnormal DRE/nodular prostate.  He denies any current issues with urination. He states that he is voiding well. Good stream. No hesitancy. Little dribbling.  He has had PSAs that have been normal in the past.  Did not follow-up with urology.   PMH, Surgical Hx, Family Hx, Social History reviewed and updated as below.  Past Medical History  Diagnosis Date  . Incisional hernia     ventral  . Arthritis   . Depression   . Hypertension   . Asthma     as a child  . Pneumonia     hx of pneumonia  . Sleep apnea    Past Surgical History  Procedure Laterality Date  . 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 Tonsillectomy    . Elbow surgery Right 07/2011    tendon repair  . Knee arthroscopy w/ acl reconstruction Right 2000's  . Kidney stones      surgery, stone to large to pass  . Anterior cervical decomp/discectomy fusion N/A 09/17/2013    Procedure: ANTERIOR CERVICAL DECOMPRESSION/DISCECTOMY FUSION 2 LEVELS;  Surgeon: Charlie Pitter, MD;  Location: [facility];  Service: Neurosurgery;  Laterality: N/A;  . Hernia repair    . Total hip  arthroplasty Left 11/30/14   No family history on file.   Social History  Substance Use Topics  . Smoking status: Former Smoker -- 1.00 packs/day for 5 years    Types: Cigarettes    Quit date: 10/16/1961  . Smokeless tobacco: Never Used  . Alcohol Use: 4.2 oz/week    7 Cans of beer per week   Review of Systems  Constitutional: Negative.   Genitourinary: Negative for difficulty urinating.       Erectile dysfunction.   Objective:   Today's Vitals: BP 116/72 mmHg  Pulse 71  Temp(Src) 97.4 F (36.3 C) (Oral)  Ht 5\' 8"  (1.727 m)  Wt 159 lb 2 oz (72.179 kg)  BMI 24.20 kg/m2  SpO2 95%  Physical Exam  Constitutional: He is oriented to person, place, and time. He appears well-developed. No distress.  HENT:  Head: Normocephalic and atraumatic.  Eyes: Conjunctivae are normal. No scleral icterus.  Cardiovascular: Normal rate and regular rhythm.   Pulmonary/Chest: Effort normal and breath sounds normal.  Abdominal: Soft. He exhibits no distension. There is no tenderness. There is no rebound and no guarding.  Genitourinary:  Prostate exam - firm, slightly enlarged. No discrete, palpable nodule/mass.  Neurological: He is alert and oriented to person, place, and time.  Psychiatric:  He has a normal mood and affect.  Vitals reviewed.   Assessment & Plan:   Problem List Items Addressed This Visit    Hyperlipidemia    Uncontrolled. Discussed treatment with statin and patient declined given side effects that his family members and friends and had with these medications. I urged him to reconsider given his ASCVD risk score.      Relevant Medications   sildenafil (VIAGRA) 50 MG tablet   Essential hypertension, benign    Well controlled. Not on any medications this time. Willl continue to monitor.      Relevant Medications   sildenafil (VIAGRA) 50 MG tablet   Erectile dysfunction    Chronic. New problem to me. Likely organic in nature. Treating with Viagra.      Enlarged  prostate    I did not appreciate any masses on exam. Patient is not having any difficulty with urination at this time. Obtaining PSA.       Other Visit Diagnoses    Prostate cancer screening    -  Primary    Relevant Orders    PSA       Outpatient Encounter Prescriptions as of 01/17/2016  Medication Sig  . esomeprazole (NEXIUM) 20 MG capsule Take 20 mg by mouth daily at 12 noon.  . fluticasone (FLONASE) 50 MCG/ACT nasal spray Place 2 sprays into both nostrils daily.  . meloxicam (MOBIC) 15 MG tablet Take 1 tablet (15 mg total) by mouth daily.  . sildenafil (VIAGRA) 50 MG tablet Take 1-2 tablets (50-100 mg total) by mouth daily as needed for erectile dysfunction. 1 hour prior to sex.  . [DISCONTINUED] cyclobenzaprine (FLEXERIL) 10 MG tablet Take 1 tablet (10 mg total) by mouth 3 (three) times daily as needed for muscle spasms.  . [DISCONTINUED] Ketotifen Fumarate (ALAWAY OP) Place 1 drop into both eyes daily as needed.   No facility-administered encounter medications on file as of 01/17/2016.    Follow-up: 6 months to 1 year.  [place]

## 2016-01-17 NOTE — Assessment & Plan Note (Signed)
Well controlled. Not on any medications this time. Willl continue to monitor.

## 2016-01-17 NOTE — Assessment & Plan Note (Signed)
I did not appreciate any masses on exam. Patient is not having any difficulty with urination at this time. Obtaining PSA.

## 2016-01-17 NOTE — Assessment & Plan Note (Signed)
Uncontrolled. Discussed treatment with statin and patient declined given side effects that his family members and friends and had with these medications. I urged him to reconsider given his ASCVD risk score.

## 2016-01-17 NOTE — Patient Instructions (Addendum)
Use the viagra as indicated.  BP is well controlled.  Consider treatment of your cholesterol.  Start 81 mg of Aspirin daily.  We will call with your lab results.   Follow up in 6 months to 1 year or sooner if needed.  Take care  Dr. Lacinda Axon

## 2016-01-19 LAB — PSA: PSA: 1.52 ng/mL (ref 0.10–4.00)

## 2016-03-04 ENCOUNTER — Telehealth: Payer: Self-pay | Admitting: Family Medicine

## 2016-03-04 NOTE — Telephone Encounter (Signed)
Please advise thanks.

## 2016-03-04 NOTE — Telephone Encounter (Signed)
Needs to be seen

## 2016-03-04 NOTE — Telephone Encounter (Signed)
Please  Call and schedule patient for an appointment

## 2016-03-04 NOTE — Telephone Encounter (Signed)
Pt has a  External Hemorid that is really bothering him. He does not know if he should be seen or referred. Please call pt at [phone], pt says you can leave a message on this number also.

## 2016-07-22 ENCOUNTER — Ambulatory Visit: Payer: Medicare Other

## 2016-07-22 ENCOUNTER — Other Ambulatory Visit: Payer: Self-pay | Admitting: Family Medicine

## 2016-07-22 VITALS — BP 130/70 | HR 63 | Temp 97.9°F | Resp 14 | Ht 67.0 in | Wt 154.8 lb

## 2016-07-22 DIAGNOSIS — Z0000 Encounter for general adult medical examination without abnormal findings: Secondary | ICD-10-CM

## 2016-07-22 MED ORDER — HYDROCOD POLST-CPM POLST ER 10-8 MG/5ML PO SUER: 5.0000 mL | Freq: Two times a day (BID) | ORAL | 0 refills | Status: DC | PRN

## 2016-07-22 NOTE — Progress Notes (Signed)
Subjective:   James Logan is a 70 y.o. male who presents for Medicare Annual/Subsequent preventive examination.  Review of Systems:  No ROS.  Medicare Wellness Visit.  Cardiac Risk Factors include: advanced age (>84men, >34 women);hypertension     Objective:    Vitals: BP 130/70 (BP Location: Left Arm, Patient Position: Sitting, Cuff Size: Normal)   Pulse 63   Temp 97.9 F (36.6 C) (Oral)   Resp 14   Ht 5\' 7"  (1.702 m)   Wt 154 lb 12.8 oz (70.2 kg)   SpO2 96%   BMI 24.25 kg/m   Body mass index is 24.25 kg/m.  Tobacco History  Smoking Status  . Former Smoker  . Packs/day: 1.00  . Years: 5.00  . Types: Cigarettes  . Quit date: 10/16/1961  Smokeless Tobacco  . Never Used     Counseling given: Not Answered   Past Medical History:  Diagnosis Date  . Arthritis   . Asthma    as a child  . Depression   . Hypertension   . Incisional hernia    ventral  . Pneumonia    hx of pneumonia  . Sleep apnea    Past Surgical History:  Procedure Laterality Date  . ANTERIOR CERVICAL DECOMP/DISCECTOMY FUSION N/A 09/17/2013   Procedure: ANTERIOR CERVICAL DECOMPRESSION/DISCECTOMY FUSION 2 LEVELS;  Surgeon: Charlie Pitter, MD;  Location: [facility];  Service: Neurosurgery;  Laterality: N/A;  . ELBOW SURGERY Right 07/2011   tendon repair  . HERNIA REPAIR    . kidney stones     surgery, stone to large to pass  . KNEE ARTHROSCOPY W/ ACL RECONSTRUCTION Right 2000's  . 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 TONSILLECTOMY    . TOTAL HIP ARTHROPLASTY Left 11/30/14   Family History  Problem Relation Age of Onset  . Cancer Maternal Grandmother   . Cancer Paternal Grandmother    History  Sexual Activity  . Sexual activity: No    Outpatient Encounter Prescriptions as of 07/22/2016  Medication Sig  . esomeprazole (NEXIUM) 20 MG capsule Take 20 mg by mouth daily at 12 noon.  . fluticasone (FLONASE) 50 MCG/ACT nasal spray Place 2 sprays into both nostrils daily.  .  meloxicam (MOBIC) 15 MG tablet Take 1 tablet (15 mg total) by mouth daily.  . [DISCONTINUED] sildenafil (VIAGRA) 50 MG tablet Take 1-2 tablets (50-100 mg total) by mouth daily as needed for erectile dysfunction. 1 hour prior to sex.   No facility-administered encounter medications on file as of 07/22/2016.     Activities of Daily Living In your present state of health, do you have any difficulty performing the following activities: 07/22/2016  Hearing? Y  Vision? N  Difficulty concentrating or making decisions? N  Walking or climbing stairs? Y  Dressing or bathing? N  Doing errands, shopping? N  Preparing Food and eating ? N  Using the Toilet? N  In the past six months, have you accidently leaked urine? N  Do you have problems with loss of bowel control? N  Managing your Medications? N  Managing your Finances? N  Housekeeping or managing your Housekeeping? N  Some recent data might be hidden    Patient Care Team: Coral Spikes, DO as PCP - General (Family Medicine)   Assessment:    This is a routine wellness examination for James Logan. The goal of the wellness visit is to assist the patient how to close the gaps in care and  create a preventative care plan for the patient.   Osteoporosis risk reviewed.  Medications reviewed; taking without issues or barriers.  Safety issues reviewed; smoke detectors in the home. Firearms locked in a safe within the home. Wears seatbelts when driving or riding with others. No violence in the home.  No identified risk were noted; The patient was oriented x 3; appropriate in dress and manner and no objective failures at ADL's or IADL's.   Body mass index; discussed the importance of a healthy diet, water intake and exercise. Educational material provided.  Influenza vaccine postponed, ill with cough and congestion today.  Patient Concerns: Productive cough.  Requests cough medicine.  Verbalized understanding if symptoms worsen he will need to  be seen in the office before an antibiotic or any other medication is dispensed.  Medication dispensed per PCP.  Exercise Activities and Dietary recommendations Current Exercise Habits: The patient does not participate in regular exercise at present  Goals    . Healthy Lifestyle          Stay active and start going to the gym again. Low carb foods. Lean meats, vegetables. Stay hydrated and drink plenty of fluids/water.      Fall Risk Fall Risk  07/22/2016 10/13/2015 10/10/2014 10/10/2014  Falls in the past year? No No No Yes  Number falls in past yr: - - - 1  Injury with Fall? - - - No   Depression Screen PHQ 2/9 Scores 07/22/2016 10/13/2015 10/10/2014 10/10/2014  PHQ - 2 Score 0 0 0 0    Cognitive Testing MMSE - Mini Mental State Exam 07/22/2016  Orientation to time 5  Orientation to Place 5  Registration 3  Attention/ Calculation 5  Recall 2  Language- name 2 objects 2  Language- repeat 1  Language- follow 3 step command 3  Language- read & follow direction 1  Write a sentence 1  Copy design 1  Total score 29    Immunization History  Administered Date(s) Administered  . Influenza Split 07/13/2012, 07/01/2013, 07/27/2014  . Influenza, High Dose Seasonal PF 08/04/2015  . Pneumococcal Conjugate-13 10/10/2014  . Pneumococcal Polysaccharide-23 06/29/2013  . Tdap 05/04/2014   Screening Tests Health Maintenance  Topic Date Due  . INFLUENZA VACCINE  12/05/2016 (Originally 06/04/2016)  . COLONOSCOPY  07/09/2021  . TETANUS/TDAP  05/04/2024  . ZOSTAVAX  Addressed  . Hepatitis C Screening  Completed  . PNA vac Low Risk Adult  Completed      Plan:   End of life planning; Advance aging; Advanced directives discussed. Unsure if he has completed HCPOA/Living Will and will discuss with wife.  Additional information given. Copy of completed HCPOA/Living Will short forms requested.  Medicare Attestation I have personally reviewed: The patient's medical and social history Their  use of alcohol, tobacco or illicit drugs Their current medications and supplements The patient's functional ability including ADLs,fall risks, home safety risks, cognitive, and hearing and visual impairment Diet and physical activities Evidence for depression   The patient's weight, height, BMI, and visual acuity have been recorded in the chart.  I have made referrals and provided education to the patient based on review of the above and I have provided the patient with a written personalized care plan for preventive services.    During the course of the visit the patient was educated and counseled about the following appropriate screening and preventive services:   Vaccines to include Pneumoccal, Influenza, Hepatitis B, Td, Zostavax, HCV  Electrocardiogram  Cardiovascular Disease  Colorectal cancer screening  Diabetes screening  Prostate Cancer Screening  Glaucoma screening  Nutrition counseling   Smoking cessation counseling  Patient Instructions (the written plan) was given to the patient.    Varney Biles, LPN  [id number]

## 2016-07-22 NOTE — Progress Notes (Signed)
Care was provided under my supervision. I agree with the management as indicated in the note.  Thersa Salt DO   Patient given Rx for tussionex for cough.

## 2016-07-22 NOTE — Patient Instructions (Addendum)
  Mr. James Logan , Thank you for taking time to come for your Medicare Wellness Visit. I appreciate your ongoing commitment to your health goals. Please review the following plan we discussed and let me know if I can assist you in the future.   FOLLOW UP WITH DR. COOK AS NEEDED.  These are the goals we discussed: Goals    . Healthy Lifestyle          Stay active and start going to the gym again. Low carb foods. Lean meats, vegetables. Stay hydrated and drink plenty of fluids/water.       This is a list of the screening recommended for you and due dates:  Health Maintenance  Topic Date Due  . Flu Shot  12/05/2016*  . Colon Cancer Screening  07/09/2021  . Tetanus Vaccine  05/04/2024  . Shingles Vaccine  Addressed  .  Hepatitis C: One time screening is recommended by Center for Disease Control  (CDC) for  adults born from [birth year] through 1966.   Completed  . Pneumonia vaccines  Completed  *Topic was postponed. The date shown is not the original due date.

## 2016-08-31 DIAGNOSIS — Z23 Encounter for immunization: Secondary | ICD-10-CM | POA: Diagnosis not present

## 2016-10-06 DIAGNOSIS — J019 Acute sinusitis, unspecified: Secondary | ICD-10-CM | POA: Diagnosis not present

## 2016-10-18 ENCOUNTER — Ambulatory Visit (INDEPENDENT_AMBULATORY_CARE_PROVIDER_SITE_OTHER): Payer: Medicare Other | Admitting: Family Medicine

## 2016-10-18 ENCOUNTER — Encounter: Payer: Self-pay | Admitting: Family Medicine

## 2016-10-18 DIAGNOSIS — J329 Chronic sinusitis, unspecified: Secondary | ICD-10-CM | POA: Diagnosis not present

## 2016-10-18 MED ORDER — DOXYCYCLINE HYCLATE 100 MG PO TABS: 100.0000 mg | ORAL_TABLET | Freq: Two times a day (BID) | ORAL | 0 refills | Status: DC

## 2016-10-18 NOTE — Assessment & Plan Note (Signed)
New problem. Treating with Doxycycline.

## 2016-10-18 NOTE — Progress Notes (Signed)
Subjective:  Patient ID: James Logan, male    DOB: 02-15-1946  Age: 70 y.o. MRN: [id number]  CC: Sinus infection  HPI:  70 year old male presents with the above complaint.  Patient states that he's been sick since the end of November. He's had severe sinus pressure and purulent nasal discharge. He was treated for sinusitis on 12/3 via a local walk-in clinic while he was traveling. He states that since that time he has had improvement in his symptoms but not complete resolution. He's had improvement in the amount of drainage and the extent of the sinus pressure and pain. But he continues to have significant nasal discharge particularly when blowing the nose. No associated fevers or chills. No other complaints or concerns at this time.  Social Hx   Social History   Social History  . Marital status: Married    Spouse name: N/A  . Number of children: N/A  . Years of education: N/A   Social History Main Topics  . Smoking status: Former Smoker    Packs/day: 1.00    Years: 5.00    Types: Cigarettes    Quit date: 10/16/1961  . Smokeless tobacco: Never Used  . Alcohol use No  . Drug use: No  . Sexual activity: No   Other Topics Concern  . None   Social History Narrative  . None    Review of Systems  Constitutional: Negative for fever.  HENT: Positive for postnasal drip, sinus pain and sinus pressure.    Objective:  BP 124/62 (BP Location: Left Arm, Patient Position: Sitting, Cuff Size: Normal)   Pulse 77   Temp 97.9 F (36.6 C) (Oral)   Resp 14   Wt 162 lb 6 oz (73.7 kg)   SpO2 95%   BMI 25.43 kg/m   BP/Weight 10/18/2016 07/22/2016 [id number]  Systolic BP [id number]  Diastolic BP 62 70 72  Wt. (Lbs) 162.38 154.8 159.13  BMI 25.43 24.25 24.2   Physical Exam  Constitutional: He is oriented to person, place, and time. He appears well-developed. No distress.  HENT:  Head: Normocephalic and atraumatic.  Mouth/Throat: Oropharynx is clear and moist.    Cardiovascular: Normal rate and regular rhythm.   Pulmonary/Chest: Effort normal and breath sounds normal.  Neurological: He is alert and oriented to person, place, and time.  Psychiatric: He has a normal mood and affect.  Vitals reviewed.  Lab Results  Component Value Date   WBC 7.8 11/16/2014   HGB 13.0 12/02/2014   HCT 50.8 11/16/2014   PLT 248 12/02/2014   GLUCOSE 99 10/13/2015   CHOL 196 10/13/2015   TRIG 124.0 10/13/2015   HDL 41.90 10/13/2015   LDLDIRECT 146.8 07/12/2013   LDLCALC 130 (H) 10/13/2015   ALT 20 10/13/2015   AST 27 10/13/2015   NA 139 10/13/2015   K 4.5 10/13/2015   CL 104 10/13/2015   CREATININE 0.96 10/13/2015   BUN 20 10/13/2015   CO2 27 10/13/2015   TSH 2.55 09/08/2014   PSA 1.52 01/17/2016   INR 1.0 11/16/2014   MICROALBUR <0.7 04/12/2015   Assessment & Plan:   Problem List Items Addressed This Visit    Recurrent sinusitis    New problem. Treating with Doxycycline.      Relevant Medications   doxycycline (VIBRA-TABS) 100 MG tablet     Meds ordered this encounter  Medications  . doxycycline (VIBRA-TABS) 100 MG tablet    Sig: Take 1 tablet (100 mg total) by mouth 2 (  two) times daily.    Dispense:  14 tablet    Refill:  0   Follow-up: PRN  [place]

## 2016-10-18 NOTE — Patient Instructions (Signed)
Continue the nettipot, flonase etc.  Take the doxycycline as prescribed. This should take care of it.  Take care  Dr. Lacinda Axon

## 2016-10-18 NOTE — Progress Notes (Signed)
Pre visit review using our clinic review tool, if applicable. No additional management support is needed unless otherwise documented below in the visit note. 

## 2017-01-09 ENCOUNTER — Ambulatory Visit (INDEPENDENT_AMBULATORY_CARE_PROVIDER_SITE_OTHER): Payer: Medicare Other | Admitting: Family Medicine

## 2017-01-09 ENCOUNTER — Encounter: Payer: Self-pay | Admitting: Family Medicine

## 2017-01-09 DIAGNOSIS — J329 Chronic sinusitis, unspecified: Secondary | ICD-10-CM

## 2017-01-09 MED ORDER — DOXYCYCLINE HYCLATE 100 MG PO TABS: 100.0000 mg | ORAL_TABLET | Freq: Two times a day (BID) | ORAL | 0 refills | Status: DC

## 2017-01-09 NOTE — Assessment & Plan Note (Signed)
Established problem, acute illness last exacerbation. Patient with signs and symptoms of acute sinusitis. Treating with Doxy. Advise continue use of Flonase and Zyrtec.

## 2017-01-09 NOTE — Progress Notes (Signed)
Pre visit review using our clinic review tool, if applicable. No additional management support is needed unless otherwise documented below in the visit note. 

## 2017-01-09 NOTE — Patient Instructions (Signed)
Antibiotic as prescribed.  Continue to use the flonase and zyrtec.  Take care  Dr. Lacinda Axon

## 2017-01-09 NOTE — Progress Notes (Signed)
Subjective:  Patient ID: James Logan, male    DOB: 10/22/1946  Age: 71 y.o. MRN: [id number]  CC: Sinus pressure, congestion, drainage, ST  HPI:  71 year old male presents with the above complaints.  Patient states he's been sick since Tuesday of last week. He's had severe sinus pressure and congestion. He reports associated postnasal drip and sore throat. Additionally, he has a productive cough.  He reports subjective fever but no documented true fever. Symptoms are severe. He has been using Zyrtec, Flonase, and NyQuil without significant improvement. No known exacerbating factors. No other associated symptoms. No other complaints or concerns at this time.   Social Hx   Social History   Social History  . Marital status: Married    Spouse name: N/A  . Number of children: N/A  . Years of education: N/A   Social History Main Topics  . Smoking status: Former Smoker    Packs/day: 1.00    Years: 5.00    Types: Cigarettes    Quit date: 10/16/1961  . Smokeless tobacco: Never Used  . Alcohol use No  . Drug use: No  . Sexual activity: No   Other Topics Concern  . None   Social History Narrative  . None    Review of Systems  Constitutional:       Subjective fever.  HENT: Positive for congestion, postnasal drip, sinus pain and sinus pressure.   Respiratory: Positive for cough.    Objective:  BP 130/69   Pulse 73   Temp 97.9 F (36.6 C) (Oral)   Wt 165 lb 8 oz (75.1 kg)   SpO2 98%   BMI 25.92 kg/m   BP/Weight 01/09/2017 10/18/2016 1/77/9390  Systolic BP 300 923 300  Diastolic BP 69 62 70  Wt. (Lbs) 165.5 162.38 154.8  BMI 25.92 25.43 24.25   Physical Exam  Constitutional: He is oriented to person, place, and time. He appears well-developed. No distress.  HENT:  Head: Normocephalic and atraumatic.  Normal TMs bilaterally. Maxillary sinus tenderness to palpation, left greater than right.  Neck: Neck supple.  Cardiovascular: Normal rate and regular rhythm.     Pulmonary/Chest: Effort normal and breath sounds normal.  Lymphadenopathy:    He has no cervical adenopathy.  Neurological: He is alert and oriented to person, place, and time.  Psychiatric: He has a normal mood and affect.  Vitals reviewed.  Lab Results  Component Value Date   WBC 7.8 11/16/2014   HGB 13.0 12/02/2014   HCT 50.8 11/16/2014   PLT 248 12/02/2014   GLUCOSE 99 10/13/2015   CHOL 196 10/13/2015   TRIG 124.0 10/13/2015   HDL 41.90 10/13/2015   LDLDIRECT 146.8 07/12/2013   LDLCALC 130 (H) 10/13/2015   ALT 20 10/13/2015   AST 27 10/13/2015   NA 139 10/13/2015   K 4.5 10/13/2015   CL 104 10/13/2015   CREATININE 0.96 10/13/2015   BUN 20 10/13/2015   CO2 27 10/13/2015   TSH 2.55 09/08/2014   PSA 1.52 01/17/2016   INR 1.0 11/16/2014   MICROALBUR <0.7 04/12/2015    Assessment & Plan:   Problem List Items Addressed This Visit    Recurrent sinusitis    Established problem, acute illness last exacerbation. Patient with signs and symptoms of acute sinusitis. Treating with Doxy. Advise continue use of Flonase and Zyrtec.      Relevant Medications   doxycycline (VIBRA-TABS) 100 MG tablet      Meds ordered this encounter  Medications  .  Multiple Vitamins-Minerals (MULTIVITAMIN ADULT PO)    Sig: Take by mouth.  . doxycycline (VIBRA-TABS) 100 MG tablet    Sig: Take 1 tablet (100 mg total) by mouth 2 (two) times daily.    Dispense:  20 tablet    Refill:  0    Follow-up: As scheduled.  [place]

## 2017-01-20 ENCOUNTER — Ambulatory Visit (INDEPENDENT_AMBULATORY_CARE_PROVIDER_SITE_OTHER): Payer: Medicare Other

## 2017-01-20 ENCOUNTER — Ambulatory Visit (INDEPENDENT_AMBULATORY_CARE_PROVIDER_SITE_OTHER): Payer: Medicare Other | Admitting: Family Medicine

## 2017-01-20 VITALS — BP 138/88 | HR 56 | Temp 97.6°F | Wt 165.5 lb

## 2017-01-20 DIAGNOSIS — R739 Hyperglycemia, unspecified: Secondary | ICD-10-CM

## 2017-01-20 DIAGNOSIS — J329 Chronic sinusitis, unspecified: Secondary | ICD-10-CM | POA: Diagnosis not present

## 2017-01-20 DIAGNOSIS — R05 Cough: Secondary | ICD-10-CM

## 2017-01-20 DIAGNOSIS — E785 Hyperlipidemia, unspecified: Secondary | ICD-10-CM | POA: Diagnosis not present

## 2017-01-20 LAB — CBC
HEMATOCRIT: 48.9 % (ref 39.0–52.0)
HEMOGLOBIN: 17.2 g/dL — AB (ref 13.0–17.0)
MCHC: 35.3 g/dL (ref 30.0–36.0)
MCV: 96.3 fl (ref 78.0–100.0)
PLATELETS: 321 10*3/uL (ref 150.0–400.0)
RBC: 5.07 Mil/uL (ref 4.22–5.81)
RDW: 14 % (ref 11.5–15.5)
WBC: 8.1 10*3/uL (ref 4.0–10.5)

## 2017-01-20 LAB — LIPID PANEL
CHOLESTEROL: 198 mg/dL (ref 0–200)
HDL: 37.3 mg/dL — AB (ref >39.00)
NonHDL: 160.79
Total CHOL/HDL Ratio: 5
Triglycerides: 237 mg/dL — ABNORMAL HIGH (ref 0.0–149.0)
VLDL: 47.4 mg/dL — ABNORMAL HIGH (ref 0.0–40.0)

## 2017-01-20 LAB — LDL CHOLESTEROL, DIRECT: Direct LDL: 122 mg/dL

## 2017-01-20 LAB — COMPREHENSIVE METABOLIC PANEL
ALBUMIN: 4.1 g/dL (ref 3.5–5.2)
ALK PHOS: 54 U/L (ref 39–117)
ALT: 22 U/L (ref 0–53)
AST: 25 U/L (ref 0–37)
BILIRUBIN TOTAL: 0.6 mg/dL (ref 0.2–1.2)
BUN: 17 mg/dL (ref 6–23)
CALCIUM: 9.4 mg/dL (ref 8.4–10.5)
CO2: 26 mEq/L (ref 19–32)
CREATININE: 1.11 mg/dL (ref 0.40–1.50)
Chloride: 105 mEq/L (ref 96–112)
GFR: 69.36 mL/min (ref >60.00)
Glucose, Bld: 98 mg/dL (ref 70–99)
Potassium: 4.6 mEq/L (ref 3.5–5.1)
Sodium: 139 mEq/L (ref 135–145)
TOTAL PROTEIN: 6.9 g/dL (ref 6.0–8.3)

## 2017-01-20 LAB — HEMOGLOBIN A1C: Hgb A1c MFr Bld: 5.8 % (ref 4.6–6.5)

## 2017-01-20 MED ORDER — LEVOFLOXACIN 500 MG PO TABS: 500.0000 mg | ORAL_TABLET | Freq: Every day | ORAL | 0 refills | Status: DC

## 2017-01-20 NOTE — Assessment & Plan Note (Signed)
Established problem, worsening. Xray obtained today and was negative.  Treating with Levaquin. If does not improve, will need to see ENT.

## 2017-01-20 NOTE — Progress Notes (Signed)
Subjective:  Patient ID: James Logan, male    DOB: Jun 18, 1946  Age: 71 y.o. MRN: [id number]  CC: Recent sinusitis, not improved.  HPI:  71 year old male with a history of recurrent sinusitis presents with complaints that he's not feeling improved following recent antibiotic treatment.  Patient was recently seen on 3/8 and was placed on doxycycline.  Patient states that he had a brief improvement for approximately 1 day. He states that he still feels poorly. He reports productive cough. Thick green sputum. Nasal congestion and postnasal drip. Associated shortness of breath which is new.  No fever. No known exacerbating factors. He has completed the antibiotic treatment. He states that his symptoms are severe.  Social Hx   Social History   Social History  . Marital status: Married    Spouse name: N/A  . Number of children: N/A  . Years of education: N/A   Social History Main Topics  . Smoking status: Former Smoker    Packs/day: 1.00    Years: 5.00    Types: Cigarettes    Quit date: 10/16/1961  . Smokeless tobacco: Never Used  . Alcohol use No  . Drug use: No  . Sexual activity: No   Other Topics Concern  . Not on file   Social History Narrative  . No narrative on file    Review of Systems  Constitutional: Positive for fatigue.  HENT: Positive for congestion, postnasal drip, sinus pain and sinus pressure.   Respiratory: Positive for cough and shortness of breath.    Objective:  BP 138/88   Pulse (!) 56   Temp 97.6 F (36.4 C) (Oral)   Wt 165 lb 8 oz (75.1 kg)   SpO2 97%   BMI 25.92 kg/m   BP/Weight 01/20/2017 01/09/2017 60/08/9322  Systolic BP 557 322 025  Diastolic BP 88 69 62  Wt. (Lbs) 165.5 165.5 162.38  BMI 25.92 25.92 25.43   Physical Exam  Constitutional: He is oriented to person, place, and time. He appears well-developed. No distress.  HENT:  Head: Normocephalic and atraumatic.  Mouth/Throat: Oropharynx is clear and moist.  Normal TM's  bilaterally.  Neck: Neck supple.  Cardiovascular: Normal rate and regular rhythm.   Pulmonary/Chest: Effort normal and breath sounds normal. He has no wheezes. He has no rales.  Lymphadenopathy:    He has no cervical adenopathy.  Neurological: He is alert and oriented to person, place, and time.  Psychiatric: He has a normal mood and affect.  Vitals reviewed.  Lab Results  Component Value Date   WBC 7.8 11/16/2014   HGB 13.0 12/02/2014   HCT 50.8 11/16/2014   PLT 248 12/02/2014   GLUCOSE 99 10/13/2015   CHOL 196 10/13/2015   TRIG 124.0 10/13/2015   HDL 41.90 10/13/2015   LDLDIRECT 146.8 07/12/2013   LDLCALC 130 (H) 10/13/2015   ALT 20 10/13/2015   AST 27 10/13/2015   NA 139 10/13/2015   K 4.5 10/13/2015   CL 104 10/13/2015   CREATININE 0.96 10/13/2015   BUN 20 10/13/2015   CO2 27 10/13/2015   TSH 2.55 09/08/2014   PSA 1.52 01/17/2016   INR 1.0 11/16/2014   MICROALBUR <0.7 04/12/2015    Assessment & Plan:   Problem List Items Addressed This Visit    Hyperlipidemia   Relevant Orders   Lipid panel   Recurrent sinusitis - Primary    Established problem, worsening. Xray obtained today and was negative.  Treating with Levaquin. If does not improve, will  need to see ENT.      Relevant Medications   levofloxacin (LEVAQUIN) 500 MG tablet   Other Relevant Orders   CBC   DG Chest 2 View (Completed)    Other Visit Diagnoses    Blood glucose elevated       Relevant Orders   Hemoglobin A1c   Comprehensive metabolic panel     Follow-up: PRN  [place]

## 2017-01-20 NOTE — Patient Instructions (Signed)
We will call with your results (this will determine the course of action).  Take care  Dr. Lacinda Axon

## 2017-01-20 NOTE — Progress Notes (Signed)
Pre visit review using our clinic review tool, if applicable. No additional management support is needed unless otherwise documented below in the visit note. 

## 2017-01-22 ENCOUNTER — Other Ambulatory Visit: Payer: Self-pay | Admitting: Family Medicine

## 2017-01-22 DIAGNOSIS — D582 Other hemoglobinopathies: Secondary | ICD-10-CM

## 2017-02-12 ENCOUNTER — Ambulatory Visit (INDEPENDENT_AMBULATORY_CARE_PROVIDER_SITE_OTHER): Payer: Medicare Other | Admitting: Internal Medicine

## 2017-02-12 ENCOUNTER — Encounter: Payer: Self-pay | Admitting: Internal Medicine

## 2017-02-12 ENCOUNTER — Telehealth: Payer: Self-pay | Admitting: *Deleted

## 2017-02-12 VITALS — BP 112/84 | HR 72 | Ht 68.11 in | Wt 167.0 lb

## 2017-02-12 DIAGNOSIS — K449 Diaphragmatic hernia without obstruction or gangrene: Secondary | ICD-10-CM

## 2017-02-12 DIAGNOSIS — J3089 Other allergic rhinitis: Secondary | ICD-10-CM

## 2017-02-12 DIAGNOSIS — J329 Chronic sinusitis, unspecified: Secondary | ICD-10-CM | POA: Diagnosis not present

## 2017-02-12 DIAGNOSIS — E782 Mixed hyperlipidemia: Secondary | ICD-10-CM

## 2017-02-12 DIAGNOSIS — R7303 Prediabetes: Secondary | ICD-10-CM | POA: Diagnosis not present

## 2017-02-12 DIAGNOSIS — N403 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: Secondary | ICD-10-CM

## 2017-02-12 DIAGNOSIS — K219 Gastro-esophageal reflux disease without esophagitis: Secondary | ICD-10-CM

## 2017-02-12 DIAGNOSIS — I10 Essential (primary) hypertension: Secondary | ICD-10-CM

## 2017-02-12 MED ORDER — MELOXICAM 15 MG PO TABS: 15.0000 mg | ORAL_TABLET | Freq: Every day | ORAL | 1 refills | Status: DC

## 2017-02-12 NOTE — Patient Instructions (Signed)
Health Maintenance  Topic Date Due  . INFLUENZA VACCINE  06/04/2017  . COLONOSCOPY  07/09/2021  . TETANUS/TDAP  05/04/2024  . Hepatitis C Screening  Completed  . PNA vac Low Risk Adult  Completed

## 2017-02-12 NOTE — Telephone Encounter (Signed)
Patient called , he requested to have all future appt cancelled , he has established with another provider .

## 2017-02-12 NOTE — Progress Notes (Signed)
Date:  02/12/2017   Name:  James Logan   DOB:  09/23/1946   MRN:  [id number]   Chief Complaint: Establish Care (Needs PCP. Was previously seen by Dr. Thersa Salt ([place])- he doesn't communicate with any of his patients well. ) Hypertension  This is a recurrent problem. The current episode started more than 1 year ago. The problem has been waxing and waning since onset. Pertinent negatives include no chest pain, headaches, palpitations or shortness of breath. Neck pain: mild s/p ACF. Past treatments include nothing. There are no compliance problems.   Gastroesophageal Reflux  He complains of heartburn. He reports no abdominal pain, no chest pain or no wheezing. Pertinent negatives include no fatigue. Risk factors include hiatal hernia.  Hyperlipidemia  This is a chronic problem. Recent lipid tests were reviewed and are variable. Pertinent negatives include no chest pain or shortness of breath. Current antihyperlipidemic treatment includes diet change and exercise. Compliance problems: recent Trig were very high - he does not want statins so has changed his diet and would like recheck in 6 months.   Benign Prostatic Hypertrophy  This is a chronic problem. The problem is unchanged. Irritative symptoms include frequency. Irritative symptoms do not include urgency. Obstructive symptoms include dribbling, incomplete emptying, an intermittent stream and a slower stream. Pertinent negatives include no chills, dysuria or hematuria. Past treatments include nothing.  Chronic recurrent sinusitis - patient has struggled with chronic recurrent sinus infections for many years. He status post septoplasty. He's been seen by ENT in the past. He is maintained on Claritin and Flonase. His most recent sinus infection required a course of 2 antibiotics for resolution.    Review of Systems  Constitutional: Negative for appetite change, chills, fatigue and unexpected weight change.  HENT: Positive  for congestion, sinus pain and sinus pressure. Negative for trouble swallowing.   Respiratory: Negative for chest tightness, shortness of breath and wheezing.   Cardiovascular: Negative for chest pain, palpitations and leg swelling.  Gastrointestinal: Positive for heartburn. Negative for abdominal pain, constipation and diarrhea.  Genitourinary: Positive for frequency and incomplete emptying. Negative for dysuria, hematuria and urgency.  Musculoskeletal: Negative for arthralgias. Neck pain: mild s/p ACF.  Skin: Negative for color change and rash.  Neurological: Negative for dizziness and headaches.  Psychiatric/Behavioral: Negative for dysphoric mood and sleep disturbance. The patient is not nervous/anxious.     Patient Active Problem List   Diagnosis Date Noted  . Recurrent sinusitis 10/18/2016  . Erectile dysfunction 01/17/2016  . History of total left hip arthroplasty 10/15/2015  . Hyperlipidemia 10/15/2015  . 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 History of nephrolithiasis 08/04/2013  . Essential hypertension, benign 07/11/2013  . Hiatal hernia with gastroesophageal reflux 06/22/2012    Prior to Admission medications   Medication Sig Start Date End Date Taking? Authorizing Provider  cetirizine (ZYRTEC) 10 MG tablet Take 10 mg by mouth daily.   Yes Historical Provider, MD  esomeprazole (NEXIUM) 20 MG capsule Take 20 mg by mouth daily at 12 noon.   Yes Historical Provider, MD  fluticasone (FLONASE) 50 MCG/ACT nasal spray Place 2 sprays into both nostrils daily. 10/10/14  Yes Crecencio Mc, MD  ibuprofen (ADVIL,MOTRIN) 200 MG tablet Take 200 mg by mouth every 6 (six) hours as needed.   Yes Historical Provider, MD  ketotifen (ZADITOR) 0.025 % ophthalmic solution 1 drop 2 (two) times daily.   Yes Historical Provider, MD  meloxicam (MOBIC) 15 MG  tablet Take 1 tablet (15 mg total) by mouth daily. 10/13/15  Yes Crecencio Mc, MD  Multiple Vitamins-Minerals  (MULTIVITAMIN ADULT PO) Take by mouth.   Yes Historical Provider, MD    No Known Allergies  Past Surgical History:  Procedure Laterality Date  . ANTERIOR CERVICAL DECOMP/DISCECTOMY FUSION N/A 09/17/2013   Procedure: ANTERIOR CERVICAL DECOMPRESSION/DISCECTOMY FUSION 2 LEVELS;  Surgeon: Charlie Pitter, MD;  Location: [facility];  Service: Neurosurgery;  Laterality: N/A;  . ELBOW SURGERY Right 07/2011   tendon repair  . HERNIA REPAIR    . kidney stones     surgery, stone to large to pass  . KNEE ARTHROSCOPY W/ ACL RECONSTRUCTION Right 2000's  . 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 TONSILLECTOMY    . TOTAL HIP ARTHROPLASTY Left 11/30/14    Social History  Substance Use Topics  . Smoking status: Former Smoker    Packs/day: 1.00    Years: 5.00    Types: Cigarettes    Quit date: 10/16/1965  . Smokeless tobacco: Never Used     Comment: quit in 1969 when come out of [employer]  . Alcohol use No     Comment: former drinker     Medication list has been reviewed and updated.   Physical Exam  Constitutional: He is oriented to person, place, and time. He appears well-developed. No distress.  HENT:  Head: Normocephalic and atraumatic.  Right Ear: Tympanic membrane and ear canal normal.  Left Ear: Tympanic membrane and ear canal normal.  Nose: Right sinus exhibits no maxillary sinus tenderness and no frontal sinus tenderness. Left sinus exhibits no maxillary sinus tenderness and no frontal sinus tenderness.  Mouth/Throat: No posterior oropharyngeal edema or posterior oropharyngeal erythema.  Neck: Normal range of motion. Neck supple. No thyromegaly present.  Cardiovascular: Normal rate, regular rhythm and normal heart sounds.   Pulmonary/Chest: Effort normal and breath sounds normal. No respiratory distress. He has no wheezes.  Musculoskeletal: He exhibits no edema or tenderness.       Lumbar back: He exhibits no bony tenderness, no deformity and no spasm.  Lymphadenopathy:         Head (right side): No submandibular adenopathy present.       Head (left side): No submandibular adenopathy present.  Neurological: He is alert and oriented to person, place, and time.  Skin: Skin is warm and dry. No rash noted.  Psychiatric: He has a normal mood and affect. His speech is normal and behavior is normal. Thought content normal.  Nursing note and vitals reviewed.   BP 112/84 (BP Location: Right Arm, Patient Position: Sitting, Cuff Size: Normal)   Pulse 72   Ht 5' 8.11" (1.73 m)   Wt 167 lb (75.8 kg)   SpO2 94%   BMI 25.31 kg/m   Assessment and Plan: 1. Essential hypertension, benign Never treated - appears normal today Will continue to monitor  2. Hiatal hernia with gastroesophageal reflux Controlled on OTC Nexium  3. Mixed hyperlipidemia Continue diet and exercise Will recheck this fall at [facility]  4. Environmental and seasonal allergies Continue current therapy Return if s/s infection occur  5. Recurrent sinusitis As above  6. Prediabetes Last A1C = 5.8; continue diet and exercise   Meds ordered this encounter  Medications  . meloxicam (MOBIC) 15 MG tablet    Sig: Take 1 tablet (15 mg total) by mouth daily.    Dispense:  90 tablet    Refill:  1  Halina Maidens, MD [facility]  02/12/2017

## 2017-02-24 ENCOUNTER — Other Ambulatory Visit: Payer: Medicare Other

## 2017-07-22 ENCOUNTER — Ambulatory Visit: Payer: BC Managed Care – PPO

## 2017-07-24 ENCOUNTER — Ambulatory Visit (INDEPENDENT_AMBULATORY_CARE_PROVIDER_SITE_OTHER): Payer: Medicare Other | Admitting: Internal Medicine

## 2017-07-24 ENCOUNTER — Encounter: Payer: Self-pay | Admitting: Internal Medicine

## 2017-07-24 VITALS — BP 128/90 | HR 66 | Ht 67.5 in | Wt 165.0 lb

## 2017-07-24 DIAGNOSIS — I10 Essential (primary) hypertension: Secondary | ICD-10-CM | POA: Diagnosis not present

## 2017-07-24 DIAGNOSIS — I4892 Unspecified atrial flutter: Secondary | ICD-10-CM | POA: Diagnosis not present

## 2017-07-24 DIAGNOSIS — Z0000 Encounter for general adult medical examination without abnormal findings: Secondary | ICD-10-CM

## 2017-07-24 DIAGNOSIS — R7303 Prediabetes: Secondary | ICD-10-CM | POA: Diagnosis not present

## 2017-07-24 DIAGNOSIS — E782 Mixed hyperlipidemia: Secondary | ICD-10-CM

## 2017-07-24 DIAGNOSIS — N403 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: Secondary | ICD-10-CM

## 2017-07-24 LAB — POCT URINALYSIS DIPSTICK
Bilirubin, UA: NEGATIVE
Glucose, UA: NEGATIVE
KETONES UA: NEGATIVE
LEUKOCYTES UA: NEGATIVE
Nitrite, UA: NEGATIVE
PH UA: 6 (ref 5.0–8.0)
PROTEIN UA: NEGATIVE
RBC UA: NEGATIVE
SPEC GRAV UA: 1.015 (ref 1.010–1.025)
Urobilinogen, UA: 0.2 E.U./dL

## 2017-07-24 NOTE — Progress Notes (Signed)
Patient: James Logan, Male    DOB: 11/03/1946, 71 y.o.   MRN: [id number] Visit Date: 07/24/2017  Today's Provider: Halina Maidens, MD   Chief Complaint  Patient presents with  . Medicare Wellness   Subjective:    Annual wellness visit James Logan is a 71 y.o. male who presents today for his Subsequent Annual Wellness Visit. He feels well. He reports exercising staying active in his yard and his shop. He reports he is sleeping fairly well.  He is very active outside and does not feel limited by fatigue or SOB. ----------------------------------------------------------- Hypertension  This is a recurrent problem. The problem has been waxing and waning since onset. Condition status: mostly diastolic elevations; pt does not check at home. Pertinent negatives include no chest pain, headaches, palpitations or shortness of breath. Agents associated with hypertension include decongestants. Past treatments include nothing.  Benign Prostatic Hypertrophy  This is a chronic problem. The problem has been gradually improving since onset. Irritative symptoms include nocturia (1-2). Irritative symptoms do not include frequency or urgency. Obstructive symptoms include a slower stream. Pertinent negatives include no chills.    Review of Systems  Constitutional: Negative for chills, fatigue and fever.  HENT: Positive for congestion, hearing loss (had aids but does not wear them) and sinus pressure.   Eyes: Negative for discharge and visual disturbance.  Respiratory: Negative for chest tightness, shortness of breath and wheezing.   Cardiovascular: Negative for chest pain, palpitations and leg swelling.  Gastrointestinal: Negative for abdominal pain, blood in stool, constipation and diarrhea.  Endocrine: Negative for polyuria.  Genitourinary: Positive for decreased urine volume (with nocturia x 1-2) and nocturia (1-2). Negative for difficulty urinating, frequency and urgency.  Musculoskeletal:  Negative for arthralgias and gait problem.  Skin: Negative for color change and rash.  Allergic/Immunologic: Positive for environmental allergies.  Neurological: Negative for dizziness, tremors, light-headedness and headaches.  Psychiatric/Behavioral: Negative for dysphoric mood and sleep disturbance.    Social History   Social History  . Marital status: Married    Spouse name: N/A  . Number of children: N/A  . Years of education: N/A   Occupational History  . Not on file.   Social History Main Topics  . Smoking status: Former Smoker    Packs/day: 1.00    Years: 5.00    Types: Cigarettes    Quit date: 10/16/1965  . Smokeless tobacco: Never Used     Comment: quit in 1969 when come out of [employer]  . Alcohol use No     Comment: former drinker  . Drug use: No  . Sexual activity: No   Other Topics Concern  . Not on file   Social History Narrative  . No narrative on file    Patient Active Problem List   Diagnosis Date Noted  . Environmental and seasonal allergies 02/12/2017  . Prediabetes 02/12/2017  . Recurrent sinusitis 10/18/2016  . Erectile dysfunction 01/17/2016  . History of total left hip arthroplasty 10/15/2015  . Hyperlipidemia 10/15/2015  . 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 History of nephrolithiasis 08/04/2013  . Essential hypertension, benign 07/11/2013  . Hiatal hernia with gastroesophageal reflux 06/22/2012    Past Surgical History:  Procedure Laterality Date  . ANTERIOR CERVICAL DECOMP/DISCECTOMY FUSION N/A 09/17/2013   Procedure: ANTERIOR CERVICAL DECOMPRESSION/DISCECTOMY FUSION 2 LEVELS;  Surgeon: Charlie Pitter, MD;  Location: [facility];  Service: Neurosurgery;  Laterality: N/A;  . COLONOSCOPY  07/2011   normal - 10 yr  follow up  . ELBOW SURGERY Right 07/25/2011   tendon repair  . HERNIA REPAIR  11/23/2012  . kidney stones     surgery, stone to large to pass  . KNEE ARTHROSCOPY W/ ACL RECONSTRUCTION Right  2000's  . 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 TONSILLECTOMY  2000's  . TOTAL HIP ARTHROPLASTY Left 11/30/14    His family history includes Cancer in his maternal grandmother and paternal grandmother.     Previous Medications   CETIRIZINE (ZYRTEC) 10 MG TABLET    Take 10 mg by mouth as needed.    ESOMEPRAZOLE (NEXIUM) 20 MG CAPSULE    Take 20 mg by mouth daily at 12 noon.   FLUTICASONE (FLONASE) 50 MCG/ACT NASAL SPRAY    Place 2 sprays into both nostrils daily.   IBUPROFEN (ADVIL,MOTRIN) 200 MG TABLET    Take 200 mg by mouth every 6 (six) hours as needed.   KETOTIFEN (ZADITOR) 0.025 % OPHTHALMIC SOLUTION    1 drop 2 (two) times daily.   MELOXICAM (MOBIC) 15 MG TABLET    Take 1 tablet (15 mg total) by mouth daily.    Patient Care Team: Glean Hess, MD as PCP - General (Internal Medicine) Marry Guan, Laurice Record, MD (Orthopedic Surgery) Minna Merritts, MD as Consulting Physician (Cardiology) Murrell Redden, MD (Urology) Margaretha Sheffield, MD (Otolaryngology)      Objective:   Vitals: BP 128/90   Pulse 66   Ht 5' 7.5" (1.715 m)   Wt 165 lb (74.8 kg)   SpO2 95%   BMI 25.46 kg/m   Physical Exam  Constitutional: He is oriented to person, place, and time. He appears well-developed. No distress.  HENT:  Head: Normocephalic and atraumatic.  Cardiovascular: Normal rate, normal heart sounds and intact distal pulses.  An irregular rhythm present. Exam reveals no gallop.   No murmur heard. Pulmonary/Chest: Effort normal. No respiratory distress. He has no decreased breath sounds. He has no wheezes.  Abdominal: Soft. Normal appearance and bowel sounds are normal. There is no tenderness.    Musculoskeletal: Normal range of motion.  Neurological: He is alert and oriented to person, place, and time. He has normal reflexes.  Skin: Skin is warm, dry and intact. No rash noted.  Psychiatric: He has a normal mood and affect. His speech is normal and behavior is normal. Thought  content normal.  Nursing note and vitals reviewed.   Activities of Daily Living In your present state of health, do you have any difficulty performing the following activities: 07/24/2017  Hearing? N  Vision? N  Difficulty concentrating or making decisions? N  Walking or climbing stairs? N  Dressing or bathing? N  Doing errands, shopping? N  Preparing Food and eating ? N  Using the Toilet? N  In the past six months, have you accidently leaked urine? N  Do you have problems with loss of bowel control? N  Managing your Medications? N  Managing your Finances? N  Housekeeping or managing your Housekeeping? N  Some recent data might be hidden    Fall Risk Assessment Fall Risk  07/24/2017 07/22/2016 10/13/2015 10/10/2014 10/10/2014  Falls in the past year? No No No No Yes  Number falls in past yr: - - - - 1  Injury with Fall? - - - - No     Depression Screen PHQ 2/9 Scores 07/24/2017 07/22/2016 10/13/2015 10/10/2014  PHQ - 2 Score 0 0 0 0    6CIT Screen  07/24/2017  What Year? 0 points  What month? 0 points  What time? 0 points  Count back from 20 0 points  Months in reverse 0 points  Repeat phrase 4 points  Total Score 4    Medicare Annual Wellness Visit Summary:  Reviewed patient's Family Medical History Reviewed and updated list of patient's medical providers Assessment of cognitive impairment was done Assessed patient's functional ability Established a written schedule for health screening [place] Completed and Reviewed  Exercise Activities and Dietary recommendations Goals    . Healthy Lifestyle          Stay active and start going to the gym again. Low carb foods. Lean meats, vegetables. Stay hydrated and drink plenty of fluids/water.       Immunization History  Administered Date(s) Administered  . Influenza Split 07/13/2012, 07/01/2013, 07/27/2014  . Influenza, High Dose Seasonal PF 08/04/2015  . Influenza-Unspecified 08/31/2016  .  Pneumococcal Conjugate-13 10/10/2014, 08/31/2016  . Pneumococcal Polysaccharide-23 06/29/2013  . Tdap 05/04/2014    Health Maintenance  Topic Date Due  . COLONOSCOPY  07/09/2021  . TETANUS/TDAP  05/04/2024  . INFLUENZA VACCINE  Completed  . Hepatitis C Screening  Completed  . PNA vac Low Risk Adult  Completed    Discussed health benefits of physical activity, and encouraged him to engage in regular exercise appropriate for his age and condition.    ------------------------------------------------------------------------------------------------------------  Assessment & Plan:  1. Medicare annual wellness visit, subsequent Measures satisfied - POCT Urinalysis Dipstick  2. Essential hypertension, benign Not current on medications Will monitor and await Cardiology recommendations  3. Mixed hyperlipidemia Check labs  4. Prediabetes Continue healthy diet and active lifestyle  5. Atrial flutter, unspecified type [facility]) Refer to Cardiology - appt on Monday Chronicity unknown so will not start anticoagulation at this time - EKG 12-Lead  6. 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   Partially dictated using Editor, commissioning. Any errors are unintentional.  Halina Maidens, MD [facility]  07/24/2017

## 2017-07-24 NOTE — Patient Instructions (Signed)
Health Maintenance  Topic Date Due  . COLONOSCOPY  07/09/2021  . TETANUS/TDAP  05/04/2024  . INFLUENZA VACCINE  Completed  . Hepatitis C Screening  Completed  . PNA vac Low Risk Adult  Completed

## 2017-07-25 ENCOUNTER — Encounter: Payer: Self-pay | Admitting: Internal Medicine

## 2017-07-25 LAB — CBC WITH DIFFERENTIAL/PLATELET
BASOS ABS: 0.1 10*3/uL (ref 0.0–0.2)
BASOS: 1 %
EOS (ABSOLUTE): 0.2 10*3/uL (ref 0.0–0.4)
Eos: 3 %
Hematocrit: 49.4 % (ref 37.5–51.0)
Hemoglobin: 17.7 g/dL (ref 13.0–17.7)
Immature Grans (Abs): 0 10*3/uL (ref 0.0–0.1)
Immature Granulocytes: 0 %
LYMPHS ABS: 2.8 10*3/uL (ref 0.7–3.1)
Lymphs: 40 %
MCH: 34.9 pg — AB (ref 26.6–33.0)
MCHC: 35.8 g/dL — AB (ref 31.5–35.7)
MCV: 97 fL (ref 79–97)
Monocytes Absolute: 0.8 10*3/uL (ref 0.1–0.9)
Monocytes: 11 %
NEUTROS ABS: 3.2 10*3/uL (ref 1.4–7.0)
Neutrophils: 45 %
PLATELETS: 297 10*3/uL (ref 150–379)
RBC: 5.07 x10E6/uL (ref 4.14–5.80)
RDW: 13.8 % (ref 12.3–15.4)
WBC: 7 10*3/uL (ref 3.4–10.8)

## 2017-07-25 LAB — COMPREHENSIVE METABOLIC PANEL
A/G RATIO: 1.5 (ref 1.2–2.2)
ALBUMIN: 4.4 g/dL (ref 3.5–4.8)
ALK PHOS: 63 IU/L (ref 39–117)
ALT: 22 IU/L (ref 0–44)
AST: 28 IU/L (ref 0–40)
BILIRUBIN TOTAL: 0.8 mg/dL (ref 0.0–1.2)
BUN/Creatinine Ratio: 17 (ref 10–24)
BUN: 16 mg/dL (ref 8–27)
CHLORIDE: 99 mmol/L (ref 96–106)
CO2: 22 mmol/L (ref 20–29)
Calcium: 9.6 mg/dL (ref 8.6–10.2)
Creatinine, Ser: 0.96 mg/dL (ref 0.76–1.27)
GFR calc non Af Amer: 79 mL/min/{1.73_m2} (ref >59)
GFR, EST AFRICAN AMERICAN: 92 mL/min/{1.73_m2} (ref >59)
GLUCOSE: 86 mg/dL (ref 65–99)
Globulin, Total: 2.9 g/dL (ref 1.5–4.5)
POTASSIUM: 4.7 mmol/L (ref 3.5–5.2)
Sodium: 138 mmol/L (ref 134–144)
Total Protein: 7.3 g/dL (ref 6.0–8.5)

## 2017-07-25 LAB — LIPID PANEL
CHOLESTEROL TOTAL: 212 mg/dL — AB (ref 100–199)
Chol/HDL Ratio: 5 ratio (ref 0.0–5.0)
HDL: 42 mg/dL (ref >39)
LDL Calculated: 142 mg/dL — ABNORMAL HIGH (ref 0–99)
TRIGLYCERIDES: 138 mg/dL (ref 0–149)
VLDL Cholesterol Cal: 28 mg/dL (ref 5–40)

## 2017-07-25 LAB — HEMOGLOBIN A1C
ESTIMATED AVERAGE GLUCOSE: 120 mg/dL
Hgb A1c MFr Bld: 5.8 % — ABNORMAL HIGH (ref 4.8–5.6)

## 2017-07-25 LAB — TSH: TSH: 2.18 u[IU]/mL (ref 0.450–4.500)

## 2017-07-25 LAB — PSA: Prostate Specific Ag, Serum: 1.4 ng/mL (ref 0.0–4.0)

## 2017-10-07 ENCOUNTER — Encounter: Payer: Self-pay | Admitting: Internal Medicine

## 2017-10-07 ENCOUNTER — Ambulatory Visit: Payer: Medicare Other | Admitting: Internal Medicine

## 2017-10-07 VITALS — BP 160/112 | HR 102 | Ht 67.5 in | Wt 160.0 lb

## 2017-10-07 DIAGNOSIS — M62830 Muscle spasm of back: Secondary | ICD-10-CM

## 2017-10-07 DIAGNOSIS — K5901 Slow transit constipation: Secondary | ICD-10-CM | POA: Diagnosis not present

## 2017-10-07 MED ORDER — BACLOFEN 10 MG PO TABS: 10.0000 mg | ORAL_TABLET | Freq: Three times a day (TID) | ORAL | 0 refills | Status: DC

## 2017-10-07 NOTE — Progress Notes (Signed)
Date:  10/07/2017   Name:  James Logan   DOB:  09/11/1946   MRN:  [id number]   Chief Complaint: Mass (Noticed Lump on Left Side right at rib line. Seen chiropracter who has been working on patients back pain- showed that Dr the lump and he stated he thinks he may have a protusion coming from his bowel. Told to see PCP as soon as possible. Walked in today with wife- James Logan to have checked. Hurt back on Sunday seen chiropracter yesterday and today. Has not had BM in two days.  )  Back Pain  This is a new problem. The current episode started in the past 7 days. The problem occurs constantly. The problem is unchanged. The pain is present in the lumbar spine. The quality of the pain is described as aching and burning. The pain does not radiate. The pain is moderate. The symptoms are aggravated by twisting and lying down. Pertinent negatives include no abdominal pain, chest pain, fever or weakness. He has tried chiropractic manipulation, heat and NSAIDs for the symptoms. The treatment provided mild relief.   Abdominal fullness - fullness on left side.   He has been constipated for the past few days and nauseated.  No pain, just fullness.   Review of Systems  Constitutional: Negative for chills, fatigue and fever.  Respiratory: Negative for chest tightness and shortness of breath.   Cardiovascular: Negative for chest pain and palpitations.  Gastrointestinal: Positive for abdominal distention and constipation. Negative for abdominal pain, anal bleeding, diarrhea, nausea and vomiting.  Musculoskeletal: Positive for arthralgias and back pain.  Neurological: Negative for tremors and weakness.  Psychiatric/Behavioral: Negative for dysphoric mood and sleep disturbance.    Patient Active Problem List   Diagnosis Date Noted  . Environmental and seasonal allergies 02/12/2017  . Prediabetes 02/12/2017  . Recurrent sinusitis 10/18/2016  . Erectile dysfunction 01/17/2016  . History of total left  hip arthroplasty 10/15/2015  . Hyperlipidemia 10/15/2015  . 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 History of nephrolithiasis 08/04/2013  . Essential hypertension, benign 07/11/2013  . Hiatal hernia with gastroesophageal reflux 06/22/2012    Prior to Admission medications   Medication Sig Start Date End Date Taking? Authorizing Provider  cetirizine (ZYRTEC) 10 MG tablet Take 10 mg by mouth as needed.    Yes [provider]  esomeprazole (NEXIUM) 20 MG capsule Take 20 mg by mouth daily at 12 noon.   Yes [provider]  fluticasone (FLONASE) 50 MCG/ACT nasal spray Place 2 sprays into both nostrils daily. 10/10/14  Yes Crecencio Mc, MD  ibuprofen (ADVIL,MOTRIN) 200 MG tablet Take 200 mg by mouth every 6 (six) hours as needed.   Yes [provider]  meloxicam (MOBIC) 15 MG tablet Take 1 tablet (15 mg total) by mouth daily. 02/12/17  Yes Glean Hess, MD    No Known Allergies  Past Surgical History:  Procedure Laterality Date  . ANTERIOR CERVICAL DECOMP/DISCECTOMY FUSION N/A 09/17/2013   Procedure: ANTERIOR CERVICAL DECOMPRESSION/DISCECTOMY FUSION 2 LEVELS;  Surgeon: Charlie Pitter, MD;  Location: [facility];  Service: Neurosurgery;  Laterality: N/A;  . COLONOSCOPY  07/2011   normal - 10 yr follow up  . ELBOW SURGERY Right 07/25/2011   tendon repair  . HERNIA REPAIR  11/23/2012  . kidney stones     surgery, stone to large to pass  . KNEE ARTHROSCOPY W/ ACL RECONSTRUCTION Right 2000's  . 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 TONSILLECTOMY  2000's  . TOTAL HIP ARTHROPLASTY Left 11/30/14    Social History   Tobacco Use  . Smoking status: Former Smoker    Packs/day: 1.00    Years: 5.00    Pack years: 5.00    Types: Cigarettes    Last attempt to quit: 10/16/1965    Years since quitting: 52.0  . Smokeless tobacco: Never Used  . 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  Alcohol use: No    Comment: former drinker  . Drug use: No     Medication list has been reviewed and updated.  PHQ 2/9 Scores 07/24/2017 07/22/2016 10/13/2015 10/10/2014  PHQ - 2 Score 0 0 0 0    Physical Exam  Constitutional: He is oriented to person, place, and time. He appears well-developed. No distress.  HENT:  Head: Normocephalic and atraumatic.  Cardiovascular: Normal rate, regular rhythm and normal heart sounds.  Pulmonary/Chest: Effort normal and breath sounds normal. No respiratory distress. He has no wheezes.  Abdominal: Soft. Normal appearance. He exhibits distension (in left lateral abdomen). No hernia.  Musculoskeletal:       Lumbar back: He exhibits decreased range of motion, tenderness and spasm. He exhibits no bony tenderness.  Neurological: He is alert and oriented to person, place, and time.  Skin: Skin is warm and dry. No rash noted.  Psychiatric: He has a normal mood and affect. His behavior is normal. Thought content normal.  Nursing note and vitals reviewed.   BP (!) 160/112   Pulse (!) 102   Ht 5' 7.5" (1.715 m)   Wt 160 lb (72.6 kg)   SpO2 96%   BMI 24.69 kg/m   Assessment and Plan: 1. Muscle spasm of back Continue topicals, Advil, heat and chiropractic care - baclofen (LIORESAL) 10 MG tablet; Take 1 tablet (10 mg total) by mouth 3 (three) times daily.  Dispense: 30 each; Refill: 0  2. Slow transit constipation Begin Miralax daily Follow up if sx worsen   Meds ordered this encounter  Medications  . baclofen (LIORESAL) 10 MG tablet    Sig: Take 1 tablet (10 mg total) by mouth 3 (three) times daily.    Dispense:  30 each    Refill:  0    Partially dictated using Editor, commissioning. Any errors are unintentional.  Halina Maidens, MD [facility]  10/07/2017

## 2017-10-07 NOTE — Patient Instructions (Signed)
Miralax - one dose a day until bowels are moving regularly

## 2017-10-10 ENCOUNTER — Encounter: Payer: Self-pay | Admitting: Emergency Medicine

## 2017-10-10 ENCOUNTER — Other Ambulatory Visit: Payer: Self-pay

## 2017-10-10 ENCOUNTER — Emergency Department: Payer: Medicare Other

## 2017-10-10 ENCOUNTER — Ambulatory Visit
Admission: EM | Admit: 2017-10-10 | Discharge: 2017-10-10 | Disposition: A | Discharge status: Home / Self Care | Payer: Medicare Other | Source: Home / Self Care | Attending: Family Medicine | Admitting: Family Medicine

## 2017-10-10 ENCOUNTER — Emergency Department
Admission: EM | Admit: 2017-10-10 | Discharge: 2017-10-10 | Disposition: A | Discharge status: Home / Self Care | Payer: Medicare Other | Attending: Emergency Medicine | Admitting: Emergency Medicine

## 2017-10-10 DIAGNOSIS — Y93H2 Activity, gardening and landscaping: Secondary | ICD-10-CM | POA: Diagnosis not present

## 2017-10-10 DIAGNOSIS — X500XXA Overexertion from strenuous movement or load, initial encounter: Secondary | ICD-10-CM | POA: Insufficient documentation

## 2017-10-10 DIAGNOSIS — N200 Calculus of kidney: Secondary | ICD-10-CM | POA: Diagnosis not present

## 2017-10-10 DIAGNOSIS — R109 Unspecified abdominal pain: Secondary | ICD-10-CM | POA: Diagnosis present

## 2017-10-10 DIAGNOSIS — J45909 Unspecified asthma, uncomplicated: Secondary | ICD-10-CM | POA: Diagnosis not present

## 2017-10-10 DIAGNOSIS — Z79899 Other long term (current) drug therapy: Secondary | ICD-10-CM | POA: Insufficient documentation

## 2017-10-10 DIAGNOSIS — S29012A Strain of muscle and tendon of back wall of thorax, initial encounter: Secondary | ICD-10-CM | POA: Insufficient documentation

## 2017-10-10 DIAGNOSIS — Y9222 Religious institution as the place of occurrence of the external cause: Secondary | ICD-10-CM | POA: Insufficient documentation

## 2017-10-10 DIAGNOSIS — Z87891 Personal history of nicotine dependence: Secondary | ICD-10-CM | POA: Diagnosis not present

## 2017-10-10 DIAGNOSIS — Z96642 Presence of left artificial hip joint: Secondary | ICD-10-CM | POA: Diagnosis not present

## 2017-10-10 DIAGNOSIS — I10 Essential (primary) hypertension: Secondary | ICD-10-CM | POA: Diagnosis not present

## 2017-10-10 DIAGNOSIS — S39012A Strain of muscle, fascia and tendon of lower back, initial encounter: Secondary | ICD-10-CM

## 2017-10-10 DIAGNOSIS — Y999 Unspecified external cause status: Secondary | ICD-10-CM | POA: Insufficient documentation

## 2017-10-10 LAB — CBC WITH DIFFERENTIAL/PLATELET
Basophils Absolute: 0.1 10*3/uL (ref 0–0.1)
Basophils Relative: 1 %
EOS ABS: 0.1 10*3/uL (ref 0–0.7)
Eosinophils Relative: 1 %
HEMATOCRIT: 52.3 % — AB (ref 40.0–52.0)
HEMOGLOBIN: 17.8 g/dL (ref 13.0–18.0)
LYMPHS ABS: 2 10*3/uL (ref 1.0–3.6)
LYMPHS PCT: 18 %
MCH: 33.2 pg (ref 26.0–34.0)
MCHC: 34 g/dL (ref 32.0–36.0)
MCV: 97.6 fL (ref 80.0–100.0)
Monocytes Absolute: 0.8 10*3/uL (ref 0.2–1.0)
Monocytes Relative: 7 %
NEUTROS ABS: 8.5 10*3/uL — AB (ref 1.4–6.5)
NEUTROS PCT: 73 %
Platelets: 301 10*3/uL (ref 150–440)
RBC: 5.36 MIL/uL (ref 4.40–5.90)
RDW: 13.4 % (ref 11.5–14.5)
WBC: 11.4 10*3/uL — AB (ref 3.8–10.6)

## 2017-10-10 LAB — COMPREHENSIVE METABOLIC PANEL
ALT: 25 U/L (ref 17–63)
ANION GAP: 13 (ref 5–15)
AST: 35 U/L (ref 15–41)
Albumin: 4.8 g/dL (ref 3.5–5.0)
Alkaline Phosphatase: 60 U/L (ref 38–126)
BUN: 19 mg/dL (ref 6–20)
CHLORIDE: 100 mmol/L — AB (ref 101–111)
CO2: 23 mmol/L (ref 22–32)
CREATININE: 1.03 mg/dL (ref 0.61–1.24)
Calcium: 9.3 mg/dL (ref 8.9–10.3)
Glucose, Bld: 123 mg/dL — ABNORMAL HIGH (ref 65–99)
POTASSIUM: 4.3 mmol/L (ref 3.5–5.1)
SODIUM: 136 mmol/L (ref 135–145)
Total Bilirubin: 1.1 mg/dL (ref 0.3–1.2)
Total Protein: 8 g/dL (ref 6.5–8.1)

## 2017-10-10 LAB — URINALYSIS, COMPLETE (UACMP) WITH MICROSCOPIC
BACTERIA UA: NONE SEEN
Bilirubin Urine: NEGATIVE
GLUCOSE, UA: NEGATIVE mg/dL
HGB URINE DIPSTICK: NEGATIVE
KETONES UR: 20 mg/dL — AB
Leukocytes, UA: NEGATIVE
NITRITE: NEGATIVE
PROTEIN: 30 mg/dL — AB
Specific Gravity, Urine: 1.026 (ref 1.005–1.030)
Squamous Epithelial / LPF: NONE SEEN
pH: 5 (ref 5.0–8.0)

## 2017-10-10 MED ORDER — KETOROLAC TROMETHAMINE 30 MG/ML IJ SOLN
15.0000 mg | INTRAMUSCULAR | Status: AC
  Administered 2017-10-10: 15 mg via INTRAVENOUS
  Filled 2017-10-10: qty 1

## 2017-10-10 MED ORDER — FENTANYL CITRATE (PF) 100 MCG/2ML IJ SOLN
50.0000 ug | INTRAMUSCULAR | Status: DC | PRN
  Administered 2017-10-10: 50 ug via INTRAVENOUS
  Filled 2017-10-10: qty 2

## 2017-10-10 MED ORDER — NAPROXEN 500 MG PO TABS: 500.0000 mg | ORAL_TABLET | Freq: Two times a day (BID) | ORAL | 0 refills | Status: DC

## 2017-10-10 MED ORDER — SODIUM CHLORIDE 0.9 % IV BOLUS (SEPSIS)
1000.0000 mL | Freq: Once | INTRAVENOUS | Status: AC
  Administered 2017-10-10: 1000 mL via INTRAVENOUS

## 2017-10-10 MED ORDER — DIAZEPAM 5 MG PO TABS: 5.0000 mg | ORAL_TABLET | Freq: Three times a day (TID) | ORAL | 0 refills | Status: DC | PRN

## 2017-10-10 NOTE — ED Notes (Signed)
Pt provided urinal and asked if he could produce urine for a urine specimen. Pt trying to urinate at this time.

## 2017-10-10 NOTE — ED Provider Notes (Signed)
[facility]    CSN: [id number] Arrival date & time: 10/10/17  1308     History   Chief Complaint Chief Complaint  Patient presents with  . Back Pain    HPI James Logan is a 71 y.o. male.   71 yo male with a c/o left flank pain for the past 5 days that has been progressively worsening. Patient states now pain is so severe that he can not find a comfortable position. Denies any trauma, injuries, fevers, chills, vomiting, dysuria, gross hematuria. Saw PCP on Tuesday and thought it was muscular, however patient states muscle relaxer has not helped. Patient reports a h/o kidney stones in the past with one episode similar to this requiring surgical removal of stone.    The history is provided by the patient.  Back Pain    Past Medical History:  Diagnosis Date  . Allergy    always has trouble with sinuses  . Arthritis   . Asthma    as a child  . Depression   . Hypertension   . Incisional hernia    ventral  . Pneumonia    hx of pneumonia  . Sleep apnea     Patient Active Problem List   Diagnosis Date Noted  . Environmental and seasonal allergies 02/12/2017  . Prediabetes 02/12/2017  . Recurrent sinusitis 10/18/2016  . Erectile dysfunction 01/17/2016  . History of total left hip arthroplasty 10/15/2015  . Hyperlipidemia 10/15/2015  . 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 History of nephrolithiasis 08/04/2013  . Essential hypertension, benign 07/11/2013  . Hiatal hernia with gastroesophageal reflux 06/22/2012    Past Surgical History:  Procedure Laterality Date  . ANTERIOR CERVICAL DECOMP/DISCECTOMY FUSION N/A 09/17/2013   Procedure: ANTERIOR CERVICAL DECOMPRESSION/DISCECTOMY FUSION 2 LEVELS;  Surgeon: Charlie Pitter, MD;  Location: [facility];  Service: Neurosurgery;  Laterality: N/A;  . COLONOSCOPY  07/2011   normal - 10 yr follow up  . ELBOW SURGERY Right 07/25/2011   tendon repair  . HERNIA REPAIR  11/23/2012  .  kidney stones     surgery, stone to large to pass  . KNEE ARTHROSCOPY W/ ACL RECONSTRUCTION Right 2000's  . 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 TONSILLECTOMY  2000's  . TOTAL HIP ARTHROPLASTY Left 11/30/14       Home Medications    Prior to Admission medications   Medication Sig Start Date End Date Taking? Authorizing Provider  baclofen (LIORESAL) 10 MG tablet Take 1 tablet (10 mg total) by mouth 3 (three) times daily. 10/07/17   Glean Hess, MD  cetirizine (ZYRTEC) 10 MG tablet Take 10 mg by mouth as needed.     [provider]  esomeprazole (NEXIUM) 20 MG capsule Take 20 mg by mouth daily at 12 noon.    [provider]  fluticasone (FLONASE) 50 MCG/ACT nasal spray Place 2 sprays into both nostrils daily. 10/10/14   Crecencio Mc, MD  ibuprofen (ADVIL,MOTRIN) 200 MG tablet Take 200 mg by mouth every 6 (six) hours as needed.    [provider]  meloxicam (MOBIC) 15 MG tablet Take 1 tablet (15 mg total) by mouth daily. 02/12/17   Glean Hess, MD    Family History Family History  Problem Relation Age of Onset  . Cancer Maternal Grandmother   . Cancer Paternal Grandmother     Social History Social History   Tobacco Use  . Smoking status: Former  Smoker    Packs/day: 1.00    Years: 5.00    Pack years: 5.00    Types: Cigarettes    Last attempt to quit: 10/16/1965    Years since quitting: 52.0  . Smokeless tobacco: Never Used  . 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 Alcohol use: No    Comment: former drinker  . Drug use: No     Allergies   Patient has no known allergies.   Review of Systems Review of Systems  Musculoskeletal: Positive for back pain.     Physical Exam Triage Vital Signs ED Triage Vitals  Enc Vitals Group     BP 10/10/17 1322 (!) 162/103     Pulse Rate 10/10/17 1322 (!) 103     Resp 10/10/17 1322 20     Temp --      Temp src --      SpO2 10/10/17  1322 99 %     Weight 10/10/17 1322 160 lb (72.6 kg)     Height 10/10/17 1322 5' 7.5" (1.715 m)     Head Circumference --      Peak Flow --      Pain Score 10/10/17 1323 10     Pain Loc --      Pain Edu? --      Excl. in [place]? --    No data found.  Updated Vital Signs BP (!) 162/103 (BP Location: Right Arm)   Pulse (!) 103   Resp 20   Ht 5' 7.5" (1.715 m)   Wt 160 lb (72.6 kg)   SpO2 99%   BMI 24.69 kg/m   Visual Acuity Right Eye Distance:   Left Eye Distance:   Bilateral Distance:    Right Eye Near:   Left Eye Near:    Bilateral Near:     Physical Exam  Constitutional: He appears well-developed and well-nourished. No distress.  Abdominal: Soft. Bowel sounds are normal. He exhibits distension. He exhibits no mass. There is tenderness (left flank). There is no rebound and no guarding.  Skin: He is not diaphoretic.  Nursing note and vitals reviewed.    UC Treatments / Results  Labs (all labs ordered are listed, but only abnormal results are displayed) Labs Reviewed - No data to display  EKG  EKG Interpretation None       Radiology No results found.  Procedures Procedures (including critical care time)  Medications Ordered in UC Medications - No data to display   Initial Impression / Assessment and Plan / UC Course  I have reviewed the triage vital signs and the nursing notes.  Pertinent labs & imaging results that were available during my care of the patient were reviewed by me and considered in my medical decision making (see chart for details).       Final Clinical Impressions(s) / UC Diagnoses   Final diagnoses:  Left flank pain  (severe; possibly nephrolithiasis as patient has history of similar)  ED Discharge Orders    None     Due to severity of pain and possible diagnosis recommend patient go to ED for further evaluation and management. Patient in stable condition will be driven by wife. Report called to [facility] triage RN.     Controlled Substance Prescriptions [place] Controlled Substance Registry consulted? Not Applicable   Norval Gable, MD 10/10/17 1430

## 2017-10-10 NOTE — ED Provider Notes (Signed)
[facility] Emergency Department Provider Note  ____________________________________________  Time seen: Approximately 4:42 PM  I have reviewed the triage vital signs and the nursing notes.   HISTORY  Chief Complaint Flank Pain    HPI James Logan is a 71 y.o. male who complains of left mid back pain for the past 5 days, constant, waxing and waning. Worse with movement and change in position. Nonradiating. Severe. Sharp. Has a history of kidney stone 40 years ago that required surgical removal. With that he also had urinary symptoms. He denies any urinary symptoms currently.  He does note that the symptoms started after he spent a full day blowing and raking leaves at his church and at his house. He estimates that he probably spent a total of 8 hours on this activity all at once.  Or chest pain shortness of breath fever chills sweats or weight changes. No rectal bleeding, no diarrhea or vomiting or constipation.     Past Medical History:  Diagnosis Date  . Allergy    always has trouble with sinuses  . Arthritis   . Asthma    as a child  . Depression   . Hypertension   . Incisional hernia    ventral  . Pneumonia    hx of pneumonia  . Sleep apnea      Patient Active Problem List   Diagnosis Date Noted  . Environmental and seasonal allergies 02/12/2017  . Prediabetes 02/12/2017  . Recurrent sinusitis 10/18/2016  . Erectile dysfunction 01/17/2016  . History of total left hip arthroplasty 10/15/2015  . Hyperlipidemia 10/15/2015  . 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 History of nephrolithiasis 08/04/2013  . Essential hypertension, benign 07/11/2013  . Hiatal hernia with gastroesophageal reflux 06/22/2012     Past Surgical History:  Procedure Laterality Date  . ANTERIOR CERVICAL DECOMP/DISCECTOMY FUSION N/A 09/17/2013   Procedure: ANTERIOR CERVICAL DECOMPRESSION/DISCECTOMY FUSION 2 LEVELS;  Surgeon: Charlie Pitter, MD;  Location: [place] [facility];  Service: Neurosurgery;  Laterality: N/A;  . COLONOSCOPY  07/2011   normal - 10 yr follow up  . ELBOW SURGERY Right 07/25/2011   tendon repair  . HERNIA REPAIR  11/23/2012  . kidney stones     surgery, stone to large to pass  . KNEE ARTHROSCOPY W/ ACL RECONSTRUCTION Right 2000's  . 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 TONSILLECTOMY  2000's  . TOTAL HIP ARTHROPLASTY Left 11/30/14     Prior to Admission medications   Medication Sig Start Date End Date Taking? Authorizing Provider  baclofen (LIORESAL) 10 MG tablet Take 1 tablet (10 mg total) by mouth 3 (three) times daily. 10/07/17  Yes Glean Hess, MD  meloxicam (MOBIC) 15 MG tablet Take 1 tablet (15 mg total) by mouth daily. 02/12/17  Yes Glean Hess, MD  cetirizine (ZYRTEC) 10 MG tablet Take 10 mg by mouth as needed.     [provider]  diazepam (VALIUM) 5 MG tablet Take 1 tablet (5 mg total) by mouth every 8 (eight) hours as needed for muscle spasms. 10/10/17   Carrie Mew, MD  fluticasone [facility]) 50 MCG/ACT nasal spray Place 2 sprays into both nostrils daily. 10/10/14   Crecencio Mc, MD  naproxen (NAPROSYN) 500 MG tablet Take 1 tablet (500 mg total) by mouth 2 (two) times daily with a meal. 10/10/17   Carrie Mew, MD     Allergies Patient has no known allergies.  Family History  Problem Relation Age of Onset  . Cancer Maternal Grandmother   . Cancer Paternal Grandmother     Social History Social History   Tobacco Use  . Smoking status: Former Smoker    Packs/day: 1.00    Years: 5.00    Pack years: 5.00    Types: Cigarettes    Last attempt to quit: 10/16/1965    Years since quitting: 52.0  . Smokeless tobacco: Never Used  . 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 Alcohol use: No    Comment: former drinker  . Drug use: No    Review of Systems  Constitutional:   No fever or chills.  ENT:    No sore throat. No rhinorrhea. Cardiovascular:   No chest pain or syncope. Respiratory:   No dyspnea or cough. Gastrointestinal:  Positive as above for left flank pain without vomiting and diarrhea.  Musculoskeletal:   Negative for focal pain or swelling All other systems reviewed and are negative except as documented above in ROS and HPI.  ____________________________________________   PHYSICAL EXAM:  VITAL SIGNS: ED Triage Vitals [10/10/17 1407]  Enc Vitals Group     BP (!) 176/109     Pulse Rate (!) 116     Resp (!) 30     Temp      Temp src      SpO2 100 %     Weight 160 lb (72.6 kg)     Height 5\' 8"  (1.727 m)     Head Circumference      Peak Flow      Pain Score 10     Pain Loc      Pain Edu?      Excl. in [place]?     Vital signs reviewed, nursing assessments reviewed.   Constitutional:   Alert and oriented. Uncomfortable but not in distress Eyes:   No scleral icterus.  EOMI. No nystagmus. No conjunctival pallor. PERRL. ENT   Head:   Normocephalic and atraumatic.   Nose:   No congestion/rhinnorhea.    Mouth/Throat:   MMM, no pharyngeal erythema. No peritonsillar mass.    Neck:   No meningismus. Full ROM. Hematological/Lymphatic/Immunilogical:   No cervical lymphadenopathy. Cardiovascular:   RRR. Symmetric bilateral radial and DP pulses.  No murmurs.  Respiratory:   Normal respiratory effort without tachypnea/retractions. Breath sounds are clear and equal bilaterally. No wheezes/rales/rhonchi. Gastrointestinal:   Soft with diffuse lower abdominal tenderness. Non distended. There is no CVA tenderness.  No rebound, rigidity, or guarding. Status post hernia repair, no open hernia defect at this time. Genitourinary:   deferred Musculoskeletal:   Normal range of motion in all extremities. No joint effusions.  No lower extremity tenderness.  No edema. No midline spinal tenderness. No point tenderness in the musculature in the left paraspinous lower thoracic region  reproduces his symptoms. Pain is worsened by change in position as well. Neurologic:   Normal speech and language.  Motor grossly intact. No gross focal neurologic deficits are appreciated.  Skin:    Skin is warm, dry and intact. No rash noted.  No petechiae, purpura, or bullae.  ____________________________________________    LABS (pertinent positives/negatives) (all labs ordered are listed, but only abnormal results are displayed) Labs Reviewed  COMPREHENSIVE METABOLIC PANEL - Abnormal; Notable for the following components:      Result Value   Chloride 100 (*)    Glucose, Bld 123 (*)  All other components within normal limits  CBC WITH DIFFERENTIAL/PLATELET - Abnormal; Notable for the following components:   WBC 11.4 (*)    HCT 52.3 (*)    Neutro Abs 8.5 (*)    All other components within normal limits  URINALYSIS, COMPLETE (UACMP) WITH MICROSCOPIC - Abnormal; Notable for the following components:   Color, Urine YELLOW (*)    APPearance CLEAR (*)    Ketones, ur 20 (*)    Protein, ur 30 (*)    All other components within normal limits   ____________________________________________   EKG    ____________________________________________    RADIOLOGY  Ct Renal Stone Study  Result Date: 10/10/2017 CLINICAL DATA:  71 year old male with left mid back and flank pain for 4-5 days not relieved with chiropractor treatment twice this week. Spasm. EXAM: CT ABDOMEN AND PELVIS WITHOUT CONTRAST TECHNIQUE: Multidetector CT imaging of the abdomen and pelvis was performed following the standard protocol without IV contrast. COMPARISON:  CT Abdomen and Pelvis 09/03/2012 FINDINGS: Lower chest: No pericardial or pleural effusion. Negative lung bases. Hepatobiliary: Negative noncontrast liver and gallbladder. Pancreas: Negative aside from partial fatty atrophy. Spleen: Surgically absent as on the 2013 comparison. Adrenals/Urinary Tract: Adrenal glands are stable and normal. Chronic right  nephrolithiasis with multiple punctate right intrarenal calculi. No hydronephrosis or perinephric stranding. Negative course of the right ureter which is nondilated. Unremarkable urinary bladder. Left nephrolithiasis with a lobulated 7-8 mm lower pole calculus. No left hydronephrosis or perinephric stranding. Decompressed left ureter. Negative course of the left ureter; the distal most left ureter is difficult to visualize due to streak artifact related to the hip arthroplasty. Stomach/Bowel: Negative rectum. Mildly redundant sigmoid colon with retained stool. Similar retained stool in the left and transverse colon. Retained stool in the right colon. Normal appendix. No large bowel inflammation identified. Negative terminal ileum. No dilated small bowel. Subtle hazy opacity at the root of the mesentery is within normal limits. Decompressed stomach and duodenum. No abdominal free fluid or free air. Vascular/Lymphatic: Vascular patency is not evaluated in the absence of IV contrast. Aortoiliac calcified atherosclerosis. No lymphadenopathy. Reproductive: Negative. Other: No pelvic free fluid. Chronic postoperative changes to the ventral abdominal wall. Repair of the ventral abdominal hernias seen in 2013. Small area of probable chronic fat necrosis subjacent to the right rectus muscle on series 2, image 38. Musculoskeletal: Chronic degenerative changes throughout the spine. Osteopenia. Multilevel severe lumbar facet degeneration. Partial ankylosis of both SI joints. Left hip arthroplasty. 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 No acute osseous abnormality identified. IMPRESSION: 1. Bilateral nephrolithiasis without obstructive uropathy. 7-8 mm lobulated left renal lower pole calculus. 2. No acute or inflammatory process identified in the noncontrast abdomen or pelvis. 3. 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 4. Satisfactory repair of ventral abdominal hernia since the 2013 CT. Electronically  Signed   By: Genevie Ann M.D.   On: 10/10/2017 16:07    ____________________________________________   PROCEDURES Procedures  ____________________________________________   DIFFERENTIAL DIAGNOSIS  Ureterolithiasis, diverticulitis, cystitis, bowel obstruction, herniated disc  CLINICAL IMPRESSION / ASSESSMENT AND PLAN / ED COURSE  Pertinent labs & imaging results that were available during my care of the patient were reviewed by me and considered in my medical decision making (see chart for details).   We will appearing, not in distress, but very uncomfortable. By history and exam has an overuse injury and erector spinae a strain which is very reproducible on exam. Due to his past medical history and some abdominal  wall tenderness and hernia repairs, I obtained a CT scan which was unremarkable. Labs are unremarkable. Tachycardia has resolved with pain control. Discharge home, follow-up with primary care. NSAIDs, oral Valium.      ____________________________________________   FINAL CLINICAL IMPRESSION(S) / ED DIAGNOSES    Final diagnoses:  Left flank pain  Back strain, initial encounter  Nephrolithiasis      This SmartLink is deprecated. Use AVSMEDLIST instead to display the medication list for a patient.   Portions of this note were generated with dragon dictation software. Dictation errors may occur despite best attempts at proofreading.    Carrie Mew, MD 10/10/17 [phone]

## 2017-10-10 NOTE — ED Triage Notes (Addendum)
Pain to left low-mid back on Sunday that feels like a knife. Seen by chiropractor twice this week. Also seen by Dr. Army Melia on Tuesday for same. Given muscle relaxers but not helping. Having spasms after going up his attic ladder today

## 2017-10-10 NOTE — Discharge Instructions (Signed)
Your lab tests and CT scan of the abdomen did not show any serious cause for your pain.  You do have a stone located in the lower left kidney, but this should not be causing pain at this time.  Your pain appears to be due to a muscle strain or overuse injury.

## 2017-10-10 NOTE — ED Triage Notes (Addendum)
Patient presents to ED via POV from [place] for left flank pain x 1 week. Patient panting during triage. Tachycardic, 116bpm. Patient unable to sit still in triage.

## 2017-10-10 NOTE — Discharge Instructions (Signed)
Recommend patient go to Emergency Department for further evaluation and treatment of possible kidney stone

## 2017-10-16 ENCOUNTER — Other Ambulatory Visit: Payer: Self-pay | Admitting: Internal Medicine

## 2017-10-17 ENCOUNTER — Telehealth: Payer: Self-pay

## 2017-10-17 ENCOUNTER — Other Ambulatory Visit: Payer: Self-pay | Admitting: Internal Medicine

## 2017-10-17 DIAGNOSIS — R140 Abdominal distension (gaseous): Secondary | ICD-10-CM

## 2017-10-17 NOTE — Telephone Encounter (Signed)
Patient wife called stating patient was seen at [facility] walk- in for back pain. Was given steroids and pain meds. Said back was out of line. They noticed the bulge in his stomach and gave them information for a Doctor see about it. Wants referral sent to:   Dr. Renee Rival in [facility]- general surgery. Fax#: [phone]  Please Advise.

## 2017-11-05 ENCOUNTER — Ambulatory Visit: Payer: Medicare Other | Admitting: Internal Medicine

## 2017-11-05 ENCOUNTER — Encounter: Payer: Self-pay | Admitting: Internal Medicine

## 2017-11-05 VITALS — BP 140/100 | HR 99 | Temp 97.9°F | Ht 68.0 in | Wt 163.0 lb

## 2017-11-05 DIAGNOSIS — I4892 Unspecified atrial flutter: Secondary | ICD-10-CM

## 2017-11-05 DIAGNOSIS — R21 Rash and other nonspecific skin eruption: Secondary | ICD-10-CM | POA: Diagnosis not present

## 2017-11-05 NOTE — Progress Notes (Signed)
derm   Date:  11/05/2017   Name:  James Logan   DOB:  January 02, 1946   MRN:  [id number]   Chief Complaint: Rash (Woke up NYE morning- flare up up started all over body, arms, and hands. Dry, red, scaly patches. When getting in hot shower it gets worse. Used left over surgical soap last night and made it a little better. Used to have bad flare ups as a child like this. )  Rash  This is a new problem. The current episode started yesterday. The problem has been gradually improving since onset. The rash is diffuse. The rash is characterized by redness, scaling and itchiness. He was exposed to nothing. Pertinent negatives include no fatigue, fever or shortness of breath. Past treatments include topical steroids (antibacterial soap). The treatment provided mild relief. His past medical history is significant for eczema (reports intermittent episodes of moderately severe eczema since birth).    Review of Systems  Constitutional: Negative for chills, fatigue and fever.  Respiratory: Negative for chest tightness, shortness of breath and wheezing.   Cardiovascular: Negative for chest pain, palpitations and leg swelling.  Musculoskeletal: Positive for arthralgias.  Skin: Positive for color change and rash.  Psychiatric/Behavioral: Negative for dysphoric mood and sleep disturbance.    Patient Active Problem List   Diagnosis Date Noted  . Environmental and seasonal allergies 02/12/2017  . Prediabetes 02/12/2017  . Recurrent sinusitis 10/18/2016  . Erectile dysfunction 01/17/2016  . History of total left hip arthroplasty 10/15/2015  . Hyperlipidemia 10/15/2015  . 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 History of nephrolithiasis 08/04/2013  . Essential hypertension, benign 07/11/2013  . Hiatal hernia with gastroesophageal reflux 06/22/2012    Prior to Admission medications   Medication Sig Start Date End Date Taking? Authorizing Provider  baclofen (LIORESAL) 10 MG tablet  Take 1 tablet (10 mg total) by mouth 3 (three) times daily. 10/07/17  Yes Glean Hess, MD  cetirizine (ZYRTEC) 10 MG tablet Take 10 mg by mouth as needed.    Yes [provider]  diazepam (VALIUM) 5 MG tablet Take 1 tablet (5 mg total) by mouth every 8 (eight) hours as needed for muscle spasms. 10/10/17  Yes Carrie Mew, MD  fluticasone [facility]) 50 MCG/ACT nasal spray Place 2 sprays into both nostrils daily. 10/10/14  Yes Crecencio Mc, MD  meloxicam (MOBIC) 15 MG tablet Take 1 tablet (15 mg total) by mouth daily. 02/12/17  Yes Glean Hess, MD  naproxen (NAPROSYN) 500 MG tablet Take 1 tablet (500 mg total) by mouth 2 (two) times daily with a meal. 10/10/17  Yes Carrie Mew, MD    No Known Allergies  Past Surgical History:  Procedure Laterality Date  . ANTERIOR CERVICAL DECOMP/DISCECTOMY FUSION N/A 09/17/2013   Procedure: ANTERIOR CERVICAL DECOMPRESSION/DISCECTOMY FUSION 2 LEVELS;  Surgeon: Charlie Pitter, MD;  Location: [place] [facility];  Service: Neurosurgery;  Laterality: N/A;  . COLONOSCOPY  07/2011   normal - 10 yr follow up  . ELBOW SURGERY Right 07/25/2011   tendon repair  . HERNIA REPAIR  11/23/2012  . kidney stones     surgery, stone to large to pass  . KNEE ARTHROSCOPY W/ ACL RECONSTRUCTION Right 2000's  . 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 TONSILLECTOMY  2000's  . TOTAL HIP ARTHROPLASTY Left 11/30/14    Social History   Tobacco Use  . Smoking status: Former Smoker    Packs/day: 1.00  Years: 5.00    Pack years: 5.00    Types: Cigarettes    Last attempt to quit: 10/16/1965    Years since quitting: 52.0  . Smokeless tobacco: Never Used  . 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 Alcohol use: No    Comment: former drinker  . Drug use: No     Medication list has been reviewed and updated.  PHQ 2/9 Scores 07/24/2017 07/22/2016 10/13/2015 10/10/2014  PHQ - 2 Score 0 0 0 0    Physical  Exam  Constitutional: He is oriented to person, place, and time. He appears well-developed. No distress.  HENT:  Head: Normocephalic and atraumatic.  Mouth/Throat: No posterior oropharyngeal edema or posterior oropharyngeal erythema.  Neck: Normal range of motion. Neck supple.  Cardiovascular: Normal rate, regular rhythm and normal heart sounds.  Pulmonary/Chest: Effort normal and breath sounds normal. No respiratory distress. He has no wheezes.  Musculoskeletal: Normal range of motion.  Neurological: He is alert and oriented to person, place, and time.  Skin: Skin is warm and dry. Rash noted. Rash is maculopapular. There is erythema.  Psychiatric: He has a normal mood and affect. His behavior is normal. Thought content normal.  Nursing note and vitals reviewed.   BP (!) 140/100   Pulse 99   Temp 97.9 F (36.6 C)   Ht 5\' 8"  (1.727 m)   Wt 163 lb (73.9 kg)   SpO2 98%   BMI 24.78 kg/m   Assessment and Plan: 1. Rash and nonspecific skin eruption Uncertain cause  - Ambulatory referral to Dermatology  2. Atrial flutter by electrocardiogram [facility]) Now on eliquis   No orders of the defined types were placed in this encounter.   Partially dictated using Editor, commissioning. Any errors are unintentional.  Halina Maidens, MD [facility]  11/05/2017

## 2017-12-01 ENCOUNTER — Encounter: Payer: Self-pay | Admitting: Internal Medicine

## 2017-12-01 DIAGNOSIS — M6208 Separation of muscle (nontraumatic), other site: Secondary | ICD-10-CM | POA: Insufficient documentation

## 2018-01-21 ENCOUNTER — Encounter: Payer: Medicare Other | Admitting: Family Medicine

## 2018-06-15 ENCOUNTER — Telehealth: Payer: Self-pay | Admitting: Internal Medicine

## 2018-06-15 NOTE — Telephone Encounter (Signed)
Called to schedule Medicare Annual Wellness Visit with the Nurse Health Advisor.   If patient returns call, please note: their last AWV was on 9 /20 /18 please schedule AWV with [facility] any date after Sept 20 2019  Thank you! For any questions please contact: Jill Alexanders [phone] or Skype at: [email or web address]

## 2018-06-17 ENCOUNTER — Telehealth: Payer: Self-pay

## 2018-06-17 NOTE — Telephone Encounter (Signed)
Informed Dr Army Melia and we will see him in Jan. Thank you.

## 2018-06-17 NOTE — Telephone Encounter (Signed)
There are no physical slots for [facility] for September.  I scheduled James Logan for a physical with Dr Army Melia for next January. He does not want the wellness visit with the nurse.

## 2018-06-17 NOTE — Telephone Encounter (Signed)
Patient returning call about scheduling MAW with Ammie in September. Please Advise?  Thank you.  CB# [phone] (HOME)

## 2018-10-20 ENCOUNTER — Telehealth: Payer: Self-pay | Admitting: Internal Medicine

## 2018-10-20 NOTE — Telephone Encounter (Signed)
Tried to reach pt. At home number first; No answer.  L/M on cell voice mail to call Lattie Haw at [phone].  Need to schedule AWV-s.  Last AWV 07/24/17. lec

## 2018-10-21 NOTE — Telephone Encounter (Signed)
Received message via inbox from Dicie Beam that patient declined the AWV. lec

## 2018-11-17 ENCOUNTER — Encounter: Payer: Self-pay | Admitting: Internal Medicine

## 2018-11-17 ENCOUNTER — Ambulatory Visit (INDEPENDENT_AMBULATORY_CARE_PROVIDER_SITE_OTHER): Payer: Medicare Other | Admitting: Internal Medicine

## 2018-11-17 VITALS — BP 120/80 | HR 87 | Ht 68.0 in | Wt 164.0 lb

## 2018-11-17 DIAGNOSIS — N403 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: Secondary | ICD-10-CM

## 2018-11-17 DIAGNOSIS — Z0000 Encounter for general adult medical examination without abnormal findings: Secondary | ICD-10-CM

## 2018-11-17 DIAGNOSIS — E782 Mixed hyperlipidemia: Secondary | ICD-10-CM | POA: Diagnosis not present

## 2018-11-17 DIAGNOSIS — I10 Essential (primary) hypertension: Secondary | ICD-10-CM

## 2018-11-17 DIAGNOSIS — K439 Ventral hernia without obstruction or gangrene: Secondary | ICD-10-CM

## 2018-11-17 DIAGNOSIS — I482 Chronic atrial fibrillation, unspecified: Secondary | ICD-10-CM

## 2018-11-17 DIAGNOSIS — R7303 Prediabetes: Secondary | ICD-10-CM

## 2018-11-17 LAB — POCT URINALYSIS DIPSTICK
BILIRUBIN UA: NEGATIVE
Glucose, UA: NEGATIVE
KETONES UA: NEGATIVE
Leukocytes, UA: NEGATIVE
NITRITE UA: NEGATIVE
PH UA: 6 (ref 5.0–8.0)
PROTEIN UA: NEGATIVE
RBC UA: NEGATIVE
Spec Grav, UA: >=1.03 — AB (ref 1.010–1.025)
UROBILINOGEN UA: 0.2 U/dL

## 2018-11-17 NOTE — Progress Notes (Signed)
Date:  11/17/2018   Name:  James Logan   DOB:  [birth year]   MRN:  [id number]   Chief Complaint: Annual Exam ALEXZANDER DOLINGER is a 73 y.o. male who presents today for his Complete Annual Exam. He feels well. He reports exercising doing house repair and yard work. He reports he is sleeping well. He is up to date on immunizations.  He will get his second Shingrix in February. He continues to follow up with surgery about his abdominal hernia - it is asx so no surgery is recommended.  Hypertension  This is a chronic problem. The problem is controlled. Pertinent negatives include no chest pain, headaches, palpitations or shortness of breath. Past treatments include ACE inhibitors. Hypertensive end-organ damage includes CAD/MI.  Diabetes  He presents for his follow-up diabetic visit. Diabetes type: pre-diabetes. His disease course has been stable. Pertinent negatives for hypoglycemia include no dizziness or headaches. Pertinent negatives for diabetes include no chest pain and no fatigue. An ACE inhibitor/angiotensin II receptor blocker is being taken.  Gastroesophageal Reflux  He complains of heartburn. He reports no abdominal pain, no chest pain, no choking or no wheezing. This is a recurrent problem. The problem occurs occasionally. Pertinent negatives include no fatigue. He has tried a PPI (or essential oils) for the symptoms. The treatment provided significant relief.  Hyperlipidemia  The problem is controlled. Pertinent negatives include no chest pain, myalgias or shortness of breath. Current antihyperlipidemic treatment includes statins. The current treatment provides significant improvement of lipids.  Chronic Atrial fibrillation - followed by Cardiology - now in chronic Afib.  Started on lisinopril 10 mg last visit and on Eliquis.  He denies dizziness, palpitations, chest pain or bleeding. BPH - no longer seeing Urology.  No worsening of urinary issues, no bleeding, pain or obstructive sx.   He takes no medications for this.  Lab Results  Component Value Date   HGBA1C 5.8 (H) 07/24/2017   Lab Results  Component Value Date   CREATININE 1.03 10/10/2017   BUN 19 10/10/2017   NA 136 10/10/2017   K 4.3 10/10/2017   CL 100 (L) 10/10/2017   CO2 23 10/10/2017     Review of Systems  Constitutional: Negative for appetite change, chills, diaphoresis, fatigue and unexpected weight change.  HENT: Negative for hearing loss, tinnitus, trouble swallowing and voice change.   Eyes: Negative for visual disturbance.  Respiratory: Negative for choking, shortness of breath and wheezing.   Cardiovascular: Negative for chest pain, palpitations and leg swelling.  Gastrointestinal: Positive for abdominal distention (hernia on left side) and heartburn. Negative for abdominal pain, blood in stool, constipation and diarrhea.  Genitourinary: Negative for difficulty urinating, dysuria, frequency, hematuria and testicular pain.  Musculoskeletal: Negative for arthralgias, back pain and myalgias.  Skin: Negative for color change and rash.  Allergic/Immunologic: Negative for environmental allergies.  Neurological: Negative for dizziness, syncope and headaches.  Hematological: Negative for adenopathy.  Psychiatric/Behavioral: Negative for dysphoric mood and sleep disturbance.    Patient Active Problem List   Diagnosis Date Noted  . Separation of muscle (nontraumatic), other site 12/01/2017  . Environmental and seasonal allergies 02/12/2017  . Prediabetes 02/12/2017  . Recurrent sinusitis 10/18/2016  . Erectile dysfunction 01/17/2016  . History of total left hip arthroplasty 10/15/2015  . Hyperlipidemia 10/15/2015  . Atrial flutter by electrocardiogram ([place]) 09/13/2013  . 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 History of nephrolithiasis 08/04/2013  . Essential hypertension, benign 07/11/2013  . Hiatal  hernia with gastroesophageal reflux 06/22/2012    No Known  Allergies  Past Surgical History:  Procedure Laterality Date  . ANTERIOR CERVICAL DECOMP/DISCECTOMY FUSION N/A 09/17/2013   Procedure: ANTERIOR CERVICAL DECOMPRESSION/DISCECTOMY FUSION 2 LEVELS;  Surgeon: Charlie Pitter, MD;  Location: [facility];  Service: Neurosurgery;  Laterality: N/A;  . COLONOSCOPY  07/2011   normal - 10 yr follow up  . ELBOW SURGERY Right 07/25/2011   tendon repair  . HERNIA REPAIR  11/23/2012  . kidney stones     surgery, stone to large to pass  . KNEE ARTHROSCOPY W/ ACL RECONSTRUCTION Right 2000's  . 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 TONSILLECTOMY  2000's  . TOTAL HIP ARTHROPLASTY Left 11/30/14    Social History   Tobacco Use  . Smoking status: Former Smoker    Packs/day: 1.00    Years: 5.00    Pack years: 5.00    Types: Cigarettes    Last attempt to quit: 10/16/1965    Years since quitting: 53.1  . Smokeless tobacco: Never Used  . 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 Alcohol use: No    Comment: former drinker  . Drug use: No     Medication list has been reviewed and updated.  Current Meds  Medication Sig  . acetaminophen (TYLENOL) 325 MG tablet Take 650 mg by mouth every 6 (six) hours as needed.  [name] apixaban (ELIQUIS) 5 MG TABS tablet Take 5 mg by mouth 2 (two) times daily.  . cetirizine (ZYRTEC) 10 MG tablet Take 10 mg by mouth as needed.   . diazepam (VALIUM) 5 MG tablet Take 1 tablet (5 mg total) by mouth every 8 (eight) hours as needed for muscle spasms.  [name] esomeprazole (NEXIUM) 10 MG packet Take 10 mg by mouth daily before breakfast.  . fluticasone (FLONASE) 50 MCG/ACT nasal spray Place 2 sprays into both nostrils daily.  [name] lisinopril (PRINIVIL,ZESTRIL) 10 MG tablet Take 10 mg by mouth daily. Dr Ubaldo Glassing  . meloxicam (MOBIC) 15 MG tablet Take 1 tablet (15 mg total) by mouth daily.    PHQ 2/9 Scores 11/17/2018 07/24/2017 07/22/2016 10/13/2015  PHQ - 2 Score 0 0 0 0   Fall Risk   11/17/2018 07/24/2017 07/22/2016 10/13/2015 10/10/2014  Falls in the past year? 0 No No No No  Number falls in past yr: 0 - - - -  Injury with Fall? 0 - - - -  Follow up Falls evaluation completed - - - -   Functional Status Survey: Is the patient deaf or have difficulty hearing?: Yes(hearing aids) Does the patient have difficulty seeing, even when wearing glasses/contacts?: No Does the patient have difficulty concentrating, remembering, or making decisions?: No Does the patient have difficulty walking or climbing stairs?: No Does the patient have difficulty dressing or bathing?: No Does the patient have difficulty doing errands alone such as visiting a doctor's office or shopping?: No  Physical Exam Vitals signs and nursing note reviewed.  Constitutional:      Appearance: Normal appearance. He is well-developed.  HENT:     Head: Normocephalic.     Right Ear: Tympanic membrane, ear canal and external ear normal.     Left Ear: Tympanic membrane, ear canal and external ear normal.     Nose: Nose normal.     Mouth/Throat:     Pharynx: Uvula midline.  Eyes:     Conjunctiva/sclera: Conjunctivae normal.  Pupils: Pupils are equal, round, and reactive to light.  Neck:     Musculoskeletal: Normal range of motion and neck supple.     Thyroid: No thyromegaly.     Vascular: No carotid bruit.  Cardiovascular:     Rate and Rhythm: Normal rate. Rhythm regularly irregular.     Pulses:          Carotid pulses are 1+ on the right side and 1+ on the left side.      Radial pulses are 1+ on the right side and 1+ on the left side.       Femoral pulses are 1+ on the right side and 1+ on the left side.      Popliteal pulses are 1+ on the right side and 1+ on the left side.       Dorsalis pedis pulses are 1+ on the right side and 1+ on the left side.       Posterior tibial pulses are 1+ on the right side and 1+ on the left side.     Heart sounds: Normal heart sounds.  Pulmonary:     Effort: Pulmonary  effort is normal.     Breath sounds: Normal breath sounds. No wheezing.  Chest:     Breasts:        Right: No mass.        Left: No mass.  Abdominal:     General: Bowel sounds are normal.     Palpations: Abdomen is soft.     Tenderness: There is no abdominal tenderness.    Musculoskeletal: Normal range of motion.  Lymphadenopathy:     Cervical: No cervical adenopathy.  Skin:    General: Skin is warm and dry.  Neurological:     Mental Status: He is alert and oriented to person, place, and time.     Deep Tendon Reflexes: Reflexes are normal and symmetric.  Psychiatric:        Speech: Speech normal.        Behavior: Behavior normal.        Thought Content: Thought content normal.        Judgment: Judgment normal.    Wt Readings from Last 3 Encounters:  11/17/18 164 lb (74.4 kg)  11/05/17 163 lb (73.9 kg)  10/10/17 160 lb (72.6 kg)     BP 120/80 (BP Location: Right Arm, Patient Position: Sitting, Cuff Size: Normal)   Pulse 87   Ht 5\' 8"  (1.727 m)   Wt 164 lb (74.4 kg)   SpO2 93%   BMI 24.94 kg/m   Assessment and Plan: 1. Annual physical exam Normal exam Recommend Dermatology evaluation - POCT urinalysis dipstick  2. Essential hypertension, benign controlled - CBC with Differential/Platelet  3. 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 DRE deferred - PSA  4. Prediabetes Continue healthy diet - Comprehensive metabolic panel - Hemoglobin A1c - TSH  5. Mixed hyperlipidemia On statin therapy - Lipid panel  6. Chronic atrial fibrillation Rate controlled Now on DOAC  7. Hernia of abdominal wall Stable, followed by Gen surgery   Partially dictated using Dragon software. Any errors are unintentional.  Halina Maidens, MD [facility]  11/17/2018

## 2018-11-18 LAB — COMPREHENSIVE METABOLIC PANEL
A/G RATIO: 1.7 (ref 1.2–2.2)
ALK PHOS: 61 IU/L (ref 39–117)
ALT: 25 IU/L (ref 0–44)
AST: 28 IU/L (ref 0–40)
Albumin: 4.7 g/dL (ref 3.5–4.8)
BILIRUBIN TOTAL: 0.9 mg/dL (ref 0.0–1.2)
BUN/Creatinine Ratio: 15 (ref 10–24)
BUN: 18 mg/dL (ref 8–27)
CHLORIDE: 100 mmol/L (ref 96–106)
CO2: 23 mmol/L (ref 20–29)
Calcium: 9.9 mg/dL (ref 8.6–10.2)
Creatinine, Ser: 1.22 mg/dL (ref 0.76–1.27)
GFR calc Af Amer: 68 mL/min/{1.73_m2} (ref >59)
GFR calc non Af Amer: 58 mL/min/{1.73_m2} — ABNORMAL LOW (ref >59)
GLOBULIN, TOTAL: 2.8 g/dL (ref 1.5–4.5)
Glucose: 93 mg/dL (ref 65–99)
POTASSIUM: 4.8 mmol/L (ref 3.5–5.2)
SODIUM: 140 mmol/L (ref 134–144)
Total Protein: 7.5 g/dL (ref 6.0–8.5)

## 2018-11-18 LAB — CBC WITH DIFFERENTIAL/PLATELET
BASOS: 2 %
Basophils Absolute: 0.1 10*3/uL (ref 0.0–0.2)
EOS (ABSOLUTE): 0.1 10*3/uL (ref 0.0–0.4)
EOS: 1 %
HEMATOCRIT: 51.8 % — AB (ref 37.5–51.0)
HEMOGLOBIN: 18.5 g/dL — AB (ref 13.0–17.7)
Immature Grans (Abs): 0 10*3/uL (ref 0.0–0.1)
Immature Granulocytes: 1 %
LYMPHS ABS: 3 10*3/uL (ref 0.7–3.1)
Lymphs: 40 %
MCH: 34 pg — ABNORMAL HIGH (ref 26.6–33.0)
MCHC: 35.7 g/dL (ref 31.5–35.7)
MCV: 95 fL (ref 79–97)
MONOCYTES: 9 %
MONOS ABS: 0.7 10*3/uL (ref 0.1–0.9)
Neutrophils Absolute: 3.6 10*3/uL (ref 1.4–7.0)
Neutrophils: 47 %
Platelets: 326 10*3/uL (ref 150–450)
RBC: 5.44 x10E6/uL (ref 4.14–5.80)
RDW: 12.8 % (ref 11.6–15.4)
WBC: 7.5 10*3/uL (ref 3.4–10.8)

## 2018-11-18 LAB — LIPID PANEL
Chol/HDL Ratio: 6.4 ratio — ABNORMAL HIGH (ref 0.0–5.0)
Cholesterol, Total: 230 mg/dL — ABNORMAL HIGH (ref 100–199)
HDL: 36 mg/dL — ABNORMAL LOW (ref >39)
LDL Calculated: 148 mg/dL — ABNORMAL HIGH (ref 0–99)
Triglycerides: 229 mg/dL — ABNORMAL HIGH (ref 0–149)
VLDL CHOLESTEROL CAL: 46 mg/dL — AB (ref 5–40)

## 2018-11-18 LAB — PSA: PROSTATE SPECIFIC AG, SERUM: 1.5 ng/mL (ref 0.0–4.0)

## 2018-11-18 LAB — HEMOGLOBIN A1C
ESTIMATED AVERAGE GLUCOSE: 117 mg/dL
HEMOGLOBIN A1C: 5.7 % — AB (ref 4.8–5.6)

## 2018-11-18 LAB — TSH: TSH: 4.09 u[IU]/mL (ref 0.450–4.500)

## 2018-12-25 ENCOUNTER — Telehealth: Payer: Self-pay | Admitting: Internal Medicine

## 2018-12-25 NOTE — Telephone Encounter (Signed)
Called to schedule Medicare Annual Wellness Visit with the Nurse Health Advisor.  Patient DECLINED All AWVs from now on.  Thank you! For any questions please contact: Janace Hoard at [phone] or Skype [email or web address]

## 2019-11-06 ENCOUNTER — Encounter: Payer: Self-pay | Admitting: Emergency Medicine

## 2019-11-06 ENCOUNTER — Emergency Department: Payer: Medicare PPO

## 2019-11-06 ENCOUNTER — Emergency Department
Admission: EM | Admit: 2019-11-06 | Discharge: 2019-11-06 | Disposition: A | Discharge status: Home / Self Care | Payer: Medicare PPO | Attending: Emergency Medicine | Admitting: Emergency Medicine

## 2019-11-06 ENCOUNTER — Other Ambulatory Visit: Payer: Self-pay

## 2019-11-06 DIAGNOSIS — J45909 Unspecified asthma, uncomplicated: Secondary | ICD-10-CM | POA: Insufficient documentation

## 2019-11-06 DIAGNOSIS — I10 Essential (primary) hypertension: Secondary | ICD-10-CM | POA: Diagnosis not present

## 2019-11-06 DIAGNOSIS — U071 COVID-19: Secondary | ICD-10-CM | POA: Diagnosis not present

## 2019-11-06 DIAGNOSIS — Z87891 Personal history of nicotine dependence: Secondary | ICD-10-CM | POA: Diagnosis not present

## 2019-11-06 DIAGNOSIS — I4891 Unspecified atrial fibrillation: Secondary | ICD-10-CM | POA: Diagnosis not present

## 2019-11-06 DIAGNOSIS — I482 Chronic atrial fibrillation, unspecified: Secondary | ICD-10-CM

## 2019-11-06 DIAGNOSIS — R918 Other nonspecific abnormal finding of lung field: Secondary | ICD-10-CM | POA: Diagnosis not present

## 2019-11-06 LAB — BASIC METABOLIC PANEL
Anion gap: 13 (ref 5–15)
BUN: 16 mg/dL (ref 8–23)
CO2: 20 mmol/L — ABNORMAL LOW (ref 22–32)
Calcium: 8.9 mg/dL (ref 8.9–10.3)
Chloride: 101 mmol/L (ref 98–111)
Creatinine, Ser: 1.07 mg/dL (ref 0.61–1.24)
GFR calc Af Amer: >60 mL/min (ref >60)
GFR calc non Af Amer: >60 mL/min (ref >60)
Glucose, Bld: 119 mg/dL — ABNORMAL HIGH (ref 70–99)
Potassium: 4.3 mmol/L (ref 3.5–5.1)
Sodium: 134 mmol/L — ABNORMAL LOW (ref 135–145)

## 2019-11-06 LAB — CBC
HCT: 48.4 % (ref 39.0–52.0)
Hemoglobin: 17 g/dL (ref 13.0–17.0)
MCH: 32.8 pg (ref 26.0–34.0)
MCHC: 35.1 g/dL (ref 30.0–36.0)
MCV: 93.4 fL (ref 80.0–100.0)
Platelets: 333 10*3/uL (ref 150–400)
RBC: 5.18 MIL/uL (ref 4.22–5.81)
RDW: 13 % (ref 11.5–15.5)
WBC: 10.8 10*3/uL — ABNORMAL HIGH (ref 4.0–10.5)
nRBC: 0 % (ref 0.0–0.2)

## 2019-11-06 LAB — APTT: aPTT: 32 seconds (ref 24–36)

## 2019-11-06 LAB — TROPONIN I (HIGH SENSITIVITY)
Troponin I (High Sensitivity): 14 ng/L (ref <18)
Troponin I (High Sensitivity): 17 ng/L (ref <18)

## 2019-11-06 MED ORDER — DILTIAZEM LOAD VIA INFUSION
10.0000 mg | Freq: Once | INTRAVENOUS | Status: DC
  Filled 2019-11-06: qty 10

## 2019-11-06 MED ORDER — HYDROCOD POLST-CPM POLST ER 10-8 MG/5ML PO SUER: 5.0000 mL | Freq: Two times a day (BID) | ORAL | 0 refills | Status: DC

## 2019-11-06 MED ORDER — PREDNISONE 50 MG PO TABS: ORAL_TABLET | ORAL | 0 refills | Status: DC

## 2019-11-06 MED ORDER — IVERMECTIN 3 MG PO TABS
200.0000 ug/kg | ORAL_TABLET | Freq: Once | ORAL | Status: AC
  Administered 2019-11-06: 11:00:00 15000 ug via ORAL
  Filled 2019-11-06: qty 5

## 2019-11-06 MED ORDER — IVERMECTIN 3 MG PO TABS: 200.0000 ug/kg | ORAL_TABLET | ORAL | 0 refills | Status: DC

## 2019-11-06 MED ORDER — DILTIAZEM HCL 25 MG/5ML IV SOLN
10.0000 mg | Freq: Once | INTRAVENOUS | Status: AC
  Administered 2019-11-06: 11:00:00 10 mg via INTRAVENOUS
  Filled 2019-11-06: qty 5

## 2019-11-06 MED ORDER — HYDROCOD POLST-CPM POLST ER 10-8 MG/5ML PO SUER
5.0000 mL | Freq: Once | ORAL | Status: AC
  Administered 2019-11-06: 5 mL via ORAL
  Filled 2019-11-06: qty 5

## 2019-11-06 NOTE — ED Triage Notes (Signed)
Patient comes from [place] for a fib RVR. Patient is also COVID positive 10 days ago.

## 2019-11-06 NOTE — ED Notes (Signed)
Report received - the doctor needs another ekg after the cardizem. Pt does have a history of afib found in the computer (pt had denied afib).

## 2019-11-06 NOTE — ED Provider Notes (Signed)
[facility] Emergency Department Provider Note       Time seen: ----------------------------------------- 9:40 AM on 11/06/2019 -----------------------------------------   I have reviewed the triage vital signs and the nursing notes.  HISTORY   Chief Complaint COVID positive and atrial fibrillation RVR    HPI James Logan is a 74 y.o. male with a history of allergies, arthritis, asthma, depression, hypertension, pneumonia, hyperlipidemia who presents to the ED for rapid atrial fibrillation.  Patient is also Covid positive, tested +10 days ago.  He has had some shortness of breath and some cough, he has not had vomiting or diarrhea.  He has right upper chest pain.  Past Medical History:  Diagnosis Date  . Allergy    always has trouble with sinuses  . Arthritis   . Asthma    as a child  . Depression   . Hypertension   . Incisional hernia    ventral  . Pneumonia    hx of pneumonia  . Sleep apnea     Patient Active Problem List   Diagnosis Date Noted  . Separation of muscle (nontraumatic), other site 12/01/2017  . Environmental and seasonal allergies 02/12/2017  . Prediabetes 02/12/2017  . Recurrent sinusitis 10/18/2016  . Erectile dysfunction 01/17/2016  . History of total left hip arthroplasty 10/15/2015  . Hyperlipidemia 10/15/2015  . Chronic atrial fibrillation 09/13/2013  . 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 History of nephrolithiasis 08/04/2013  . Essential hypertension, benign 07/11/2013  . Hernia of abdominal wall 06/22/2012  . Hiatal hernia with gastroesophageal reflux 06/22/2012    Past Surgical History:  Procedure Laterality Date  . ANTERIOR CERVICAL DECOMP/DISCECTOMY FUSION N/A 09/17/2013   Procedure: ANTERIOR CERVICAL DECOMPRESSION/DISCECTOMY FUSION 2 LEVELS;  Surgeon: Charlie Pitter, MD;  Location: [facility];  Service: Neurosurgery;  Laterality: N/A;  . COLONOSCOPY  07/2011   normal - 10 yr  follow up  . ELBOW SURGERY Right 07/25/2011   tendon repair  . HERNIA REPAIR  11/23/2012  . kidney stones     surgery, stone to large to pass  . KNEE ARTHROSCOPY W/ ACL RECONSTRUCTION Right 2000's  . 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 TONSILLECTOMY  2000's  . TOTAL HIP ARTHROPLASTY Left 11/30/14    Allergies Patient has no known allergies.  Social History Social History   Tobacco Use  . Smoking status: Former Smoker    Packs/day: 1.00    Years: 5.00    Pack years: 5.00    Types: Cigarettes    Quit date: 10/16/1965    Years since quitting: 54.0  . Smokeless tobacco: Never Used  . 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 Alcohol use: No    Comment: former drinker  . Drug use: No   Review of Systems Constitutional: Negative for fever. Cardiovascular: Positive for chest pain Respiratory: Positive for shortness of breath Gastrointestinal: Negative for abdominal pain, vomiting and diarrhea. Musculoskeletal: Negative for back pain. Skin: Negative for rash. Neurological: Negative for headaches, focal weakness or numbness.  All systems negative/normal/unremarkable except as stated in the HPI  ____________________________________________   PHYSICAL EXAM:  VITAL SIGNS: ED Triage Vitals  Enc Vitals Group     BP 11/06/19 0934 123/90     Pulse Rate 11/06/19 0934 (!) 114     Resp 11/06/19 0934 (!) 28     Temp 11/06/19 0934 98.4 F (36.9 C)  Temp Source 11/06/19 0934 Oral     SpO2 11/06/19 0934 92 %     Weight 11/06/19 0935 160 lb (72.6 kg)     Height 11/06/19 0935 5\' 8"  (1.727 m)     Head Circumference --      Peak Flow --      Pain Score 11/06/19 0934 8     Pain Loc --      Pain Edu? --      Excl. in [place]? --    Constitutional: Alert and oriented. Well appearing and in no distress. Eyes: Conjunctivae are normal. Normal extraocular movements. ENT      Head: Normocephalic and atraumatic.      Nose: No  congestion/rhinnorhea.      Mouth/Throat: Mucous membranes are moist.      Neck: No stridor. Cardiovascular: Rapid rate, irregular rhythm. No murmurs, rubs, or gallops. Respiratory: Normal respiratory effort without tachypnea nor retractions. Breath sounds are clear and equal bilaterally. No wheezes/rales/rhonchi. Gastrointestinal: Soft and nontender. Normal bowel sounds Musculoskeletal: Nontender with normal range of motion in extremities. No lower extremity tenderness nor edema. Neurologic:  Normal speech and language. No gross focal neurologic deficits are appreciated.  Skin:  Skin is warm, dry and intact. No rash noted. Psychiatric: Mood and affect are normal. Speech and behavior are normal.  ____________________________________________  EKG: Interpreted by me.  Atrial fibrillation the rate of 77 bpm, right bundle branch block, normal axis, normal QT  ____________________________________________  ED COURSE:  As part of my medical decision making, I reviewed the following data within the [place] History obtained from family if available, nursing notes, old chart and ekg, as well as notes from prior ED visits. Patient presented for COVID-19 and possible atrial fibrillation, we will assess with labs and imaging as indicated at this time.   Procedures  James Logan was evaluated in Emergency Department on 11/06/2019 for the symptoms described in the history of present illness. He was evaluated in the context of the global COVID-19 pandemic, which necessitated consideration that the patient might be at risk for infection with the SARS-CoV-2 virus that causes COVID-19. Institutional protocols and algorithms that pertain to the evaluation of patients at risk for COVID-19 are in a state of rapid change based on information released by regulatory bodies including the CDC and federal and state organizations. These policies and algorithms were followed during the patient's care in  the ED.  ____________________________________________   LABS (pertinent positives/negatives)  Labs Reviewed  BASIC METABOLIC PANEL - Abnormal; Notable for the following components:      Result Value   Sodium 134 (*)    CO2 20 (*)    Glucose, Bld 119 (*)    All other components within normal limits  CBC - Abnormal; Notable for the following components:   WBC 10.8 (*)    All other components within normal limits  APTT  TROPONIN I (HIGH SENSITIVITY)  TROPONIN I (HIGH SENSITIVITY)    RADIOLOGY Images were viewed by me  Chest x-ray  IMPRESSION:  1. Ill-defined heterogeneous airspace opacities within the right  upper and left lower lung, nonspecific though could be seen in the  setting of provided history COVID-19 infection.  2. No definite evidence of pulmonary edema.  ____________________________________________   DIFFERENTIAL DIAGNOSIS   COVID-19, arrhythmia, MI, unstable angina, PE, dehydration, electrolyte abnormality  FINAL ASSESSMENT AND PLAN  COVID-19,  atrial fibrillation   Plan: The patient had presented for apparent arrhythmia in the setting  of COVID-19 infection. Patient's labs are grossly unremarkable. Patient's imaging did reflect Covid, but his oxygen saturations and vital signs are normal.  Initially given Cardizem to slow his heart rate down some, he does have chronic atrial fibrillation.  We will try ivermectin for his COVID-19.  Otherwise he is cleared for outpatient follow-up.   Laurence Aly, MD    Note: This note was generated in part or whole with voice recognition software. Voice recognition is usually quite accurate but there are transcription errors that can and very often do occur. I apologize for any typographical errors that were not detected and corrected.     Earleen Newport, MD 11/06/19 1440

## 2019-11-06 NOTE — ED Notes (Signed)
Pt is awaiting his ride - his wife is at the pharmacy and then will pick him up. Pt waiting in room as he is covid +

## 2019-11-08 ENCOUNTER — Telehealth: Payer: Self-pay | Admitting: Internal Medicine

## 2019-11-08 NOTE — Telephone Encounter (Signed)
Pt wife called saying both positive for covid on 12/23, she took pt to [facility] walkin and they sent him to the ER due to Afib. Pt was given three medications, was told to f/up with provider. Please advise?

## 2019-11-08 NOTE — Telephone Encounter (Signed)
Will contact cardiologist. 

## 2019-11-08 NOTE — Telephone Encounter (Signed)
Patient needs to call his cardiologist and follow up to get refills for this. He see's Dr Ubaldo Glassing and had his last visit over the phone in November of 2020.Thank you.

## 2019-11-10 DIAGNOSIS — I10 Essential (primary) hypertension: Secondary | ICD-10-CM | POA: Diagnosis not present

## 2019-11-10 DIAGNOSIS — I340 Nonrheumatic mitral (valve) insufficiency: Secondary | ICD-10-CM | POA: Diagnosis not present

## 2019-11-10 DIAGNOSIS — I482 Chronic atrial fibrillation, unspecified: Secondary | ICD-10-CM | POA: Diagnosis not present

## 2019-11-22 ENCOUNTER — Encounter: Payer: Medicare Other | Admitting: Internal Medicine

## 2019-11-24 DIAGNOSIS — M9902 Segmental and somatic dysfunction of thoracic region: Secondary | ICD-10-CM | POA: Diagnosis not present

## 2019-11-24 DIAGNOSIS — M5136 Other intervertebral disc degeneration, lumbar region: Secondary | ICD-10-CM | POA: Diagnosis not present

## 2019-11-24 DIAGNOSIS — M5442 Lumbago with sciatica, left side: Secondary | ICD-10-CM | POA: Diagnosis not present

## 2019-11-24 DIAGNOSIS — M546 Pain in thoracic spine: Secondary | ICD-10-CM | POA: Diagnosis not present

## 2019-11-24 DIAGNOSIS — M9905 Segmental and somatic dysfunction of pelvic region: Secondary | ICD-10-CM | POA: Diagnosis not present

## 2019-11-24 DIAGNOSIS — M9903 Segmental and somatic dysfunction of lumbar region: Secondary | ICD-10-CM | POA: Diagnosis not present

## 2019-11-30 DIAGNOSIS — I10 Essential (primary) hypertension: Secondary | ICD-10-CM | POA: Diagnosis not present

## 2019-11-30 DIAGNOSIS — R7303 Prediabetes: Secondary | ICD-10-CM | POA: Diagnosis not present

## 2019-11-30 DIAGNOSIS — Z0000 Encounter for general adult medical examination without abnormal findings: Secondary | ICD-10-CM | POA: Diagnosis not present

## 2019-11-30 DIAGNOSIS — I482 Chronic atrial fibrillation, unspecified: Secondary | ICD-10-CM | POA: Diagnosis not present

## 2019-11-30 DIAGNOSIS — E782 Mixed hyperlipidemia: Secondary | ICD-10-CM | POA: Diagnosis not present

## 2019-12-02 DIAGNOSIS — M1612 Unilateral primary osteoarthritis, left hip: Secondary | ICD-10-CM | POA: Diagnosis not present

## 2019-12-02 DIAGNOSIS — Z96642 Presence of left artificial hip joint: Secondary | ICD-10-CM | POA: Diagnosis not present

## 2020-01-19 DIAGNOSIS — M5442 Lumbago with sciatica, left side: Secondary | ICD-10-CM | POA: Diagnosis not present

## 2020-01-19 DIAGNOSIS — M9902 Segmental and somatic dysfunction of thoracic region: Secondary | ICD-10-CM | POA: Diagnosis not present

## 2020-01-19 DIAGNOSIS — M546 Pain in thoracic spine: Secondary | ICD-10-CM | POA: Diagnosis not present

## 2020-01-19 DIAGNOSIS — M9903 Segmental and somatic dysfunction of lumbar region: Secondary | ICD-10-CM | POA: Diagnosis not present

## 2020-01-19 DIAGNOSIS — M5136 Other intervertebral disc degeneration, lumbar region: Secondary | ICD-10-CM | POA: Diagnosis not present

## 2020-01-19 DIAGNOSIS — M9905 Segmental and somatic dysfunction of pelvic region: Secondary | ICD-10-CM | POA: Diagnosis not present

## 2022-05-14 ENCOUNTER — Encounter: Payer: Self-pay | Admitting: Internal Medicine

## 2022-05-15 ENCOUNTER — Ambulatory Visit: Payer: Medicare PPO | Admitting: Certified Registered Nurse Anesthetist

## 2022-05-15 ENCOUNTER — Encounter
Admission: RE | Disposition: A | Discharge status: Home / Self Care | Payer: Self-pay | Source: Home / Self Care | Attending: Internal Medicine

## 2022-05-15 ENCOUNTER — Ambulatory Visit
Admission: RE | Admit: 2022-05-15 | Discharge: 2022-05-15 | Disposition: A | Discharge status: Home / Self Care | Payer: Medicare PPO | Attending: Internal Medicine | Admitting: Internal Medicine

## 2022-05-15 ENCOUNTER — Encounter: Payer: Self-pay | Admitting: Internal Medicine

## 2022-05-15 DIAGNOSIS — J45909 Unspecified asthma, uncomplicated: Secondary | ICD-10-CM | POA: Insufficient documentation

## 2022-05-15 DIAGNOSIS — K2289 Other specified disease of esophagus: Secondary | ICD-10-CM | POA: Insufficient documentation

## 2022-05-15 DIAGNOSIS — I4819 Other persistent atrial fibrillation: Secondary | ICD-10-CM | POA: Diagnosis not present

## 2022-05-15 DIAGNOSIS — I083 Combined rheumatic disorders of mitral, aortic and tricuspid valves: Secondary | ICD-10-CM | POA: Insufficient documentation

## 2022-05-15 DIAGNOSIS — R1313 Dysphagia, pharyngeal phase: Secondary | ICD-10-CM | POA: Diagnosis present

## 2022-05-15 DIAGNOSIS — K5730 Diverticulosis of large intestine without perforation or abscess without bleeding: Secondary | ICD-10-CM | POA: Diagnosis not present

## 2022-05-15 DIAGNOSIS — Z87891 Personal history of nicotine dependence: Secondary | ICD-10-CM | POA: Insufficient documentation

## 2022-05-15 DIAGNOSIS — E785 Hyperlipidemia, unspecified: Secondary | ICD-10-CM | POA: Insufficient documentation

## 2022-05-15 DIAGNOSIS — I10 Essential (primary) hypertension: Secondary | ICD-10-CM | POA: Insufficient documentation

## 2022-05-15 DIAGNOSIS — K219 Gastro-esophageal reflux disease without esophagitis: Secondary | ICD-10-CM | POA: Diagnosis not present

## 2022-05-15 DIAGNOSIS — Z1211 Encounter for screening for malignant neoplasm of colon: Secondary | ICD-10-CM | POA: Insufficient documentation

## 2022-05-15 DIAGNOSIS — G4730 Sleep apnea, unspecified: Secondary | ICD-10-CM | POA: Diagnosis not present

## 2022-05-15 DIAGNOSIS — F32A Depression, unspecified: Secondary | ICD-10-CM | POA: Insufficient documentation

## 2022-05-15 DIAGNOSIS — K640 First degree hemorrhoids: Secondary | ICD-10-CM | POA: Insufficient documentation

## 2022-05-15 HISTORY — PX: COLONOSCOPY: SHX5424

## 2022-05-15 HISTORY — DX: Other persistent atrial fibrillation: I48.19

## 2022-05-15 HISTORY — DX: 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: N40.3

## 2022-05-15 HISTORY — DX: Hyperlipidemia, unspecified: E78.5

## 2022-05-15 HISTORY — DX: Urinary calculus, unspecified: N20.9

## 2022-05-15 HISTORY — PX: ESOPHAGOGASTRODUODENOSCOPY: SHX5428

## 2022-05-15 HISTORY — DX: Prediabetes: R73.03

## 2022-05-15 SURGERY — COLONOSCOPY
Anesthesia: General

## 2022-05-15 MED ORDER — GLYCOPYRROLATE 0.2 MG/ML IJ SOLN
INTRAMUSCULAR | Status: AC
  Filled 2022-05-15: qty 1

## 2022-05-15 MED ORDER — GLYCOPYRROLATE 0.2 MG/ML IJ SOLN
INTRAMUSCULAR | Status: DC | PRN
  Administered 2022-05-15: .1 mg via INTRAVENOUS

## 2022-05-15 MED ORDER — PROPOFOL 1000 MG/100ML IV EMUL
INTRAVENOUS | Status: AC
  Filled 2022-05-15: qty 100

## 2022-05-15 MED ORDER — LIDOCAINE HCL (PF) 2 % IJ SOLN
INTRAMUSCULAR | Status: AC
  Filled 2022-05-15: qty 5

## 2022-05-15 MED ORDER — SODIUM CHLORIDE 0.9 % IV SOLN
INTRAVENOUS | Status: DC
  Administered 2022-05-15: 20 mL/h via INTRAVENOUS

## 2022-05-15 MED ORDER — LIDOCAINE HCL (CARDIAC) PF 100 MG/5ML IV SOSY
PREFILLED_SYRINGE | INTRAVENOUS | Status: DC | PRN
  Administered 2022-05-15: 50 mg via INTRAVENOUS

## 2022-05-15 MED ORDER — PROPOFOL 10 MG/ML IV BOLUS
INTRAVENOUS | Status: DC | PRN
  Administered 2022-05-15: 150 ug/kg/min via INTRAVENOUS
  Administered 2022-05-15: 60 mg via INTRAVENOUS

## 2022-05-15 NOTE — Anesthesia Postprocedure Evaluation (Signed)
Anesthesia Post Note  Patient: James Logan [facility]  Procedure(s) Performed: COLONOSCOPY ESOPHAGOGASTRODUODENOSCOPY (EGD)  Patient location during evaluation: Endoscopy Anesthesia Type: General Level of consciousness: awake and alert Pain management: pain level controlled Vital Signs Assessment: post-procedure vital signs reviewed and stable Respiratory status: spontaneous breathing, nonlabored ventilation, respiratory function stable and patient connected to nasal cannula oxygen Cardiovascular status: blood pressure returned to baseline and stable Postop Assessment: no apparent nausea or vomiting Anesthetic complications: no   No notable events documented.   Last Vitals:  Vitals:   05/15/22 0954 05/15/22 1004  BP: 139/86 (!) 160/79  Pulse: 71 71  Resp: 15 15  Temp:    SpO2: 98% 100%    Last Pain:  Vitals:   05/15/22 1004  TempSrc:   PainSc: 0-No pain                 Arita Miss

## 2022-05-15 NOTE — Interval H&P Note (Signed)
History and Physical Interval Note:  05/15/2022 9:12 AM  James Logan  has presented today for surgery, with the diagnosis of [place].  The various methods of treatment have been discussed with the patient and family. After consideration of risks, benefits and other options for treatment, the patient has consented to  Procedure(s): COLONOSCOPY (N/A) ESOPHAGOGASTRODUODENOSCOPY (EGD) (N/A) as a surgical intervention.  The patient's history has been reviewed, patient examined, no change in status, stable for surgery.  I have reviewed the patient's chart and labs.  Questions were answered to the patient's satisfaction.     [place], [place]

## 2022-05-15 NOTE — Op Note (Signed)
[facility] Gastroenterology Patient Name: James Logan Procedure Date: 05/15/2022 9:06 AM MRN: [id number] Account #: [id number] Date of Birth: [birth year] Admit Type: Outpatient Age: 76 Room: [facility] ENDO ROOM 2 Gender: Male Note Status: Finalized Instrument Name: Park Meo [id number] Procedure:             Colonoscopy Indications:           Screening for colorectal malignant neoplasm Providers:             Lorie Apley K. Alice Reichert MD, MD Referring MD:          Ocie Cornfield. Ouida Sills MD, MD (Referring MD) Medicines:             Propofol per Anesthesia Complications:         No immediate complications. Procedure:             Pre-Anesthesia Assessment:                        - The risks and benefits of the procedure and the                         sedation options and risks were discussed with the                         patient. All questions were answered and informed                         consent was obtained.                        - Patient identification and proposed procedure were                         verified prior to the procedure by the nurse. The                         procedure was verified in the procedure room.                        - ASA Grade Assessment: III - A patient with severe                         systemic disease.                        - After reviewing the risks and benefits, the patient                         was deemed in satisfactory condition to undergo the                         procedure.                        After obtaining informed consent, the colonoscope was                         passed under direct vision. Throughout the procedure,  the patient's blood pressure, pulse, and oxygen                         saturations were monitored continuously. The                         Colonoscope was introduced through the anus and                         advanced to the the cecum, identified by appendiceal                          orifice and ileocecal valve. The colonoscopy was                         performed without difficulty. The patient tolerated                         the procedure well. The quality of the bowel                         preparation was good. The ileocecal valve, appendiceal                         orifice, and rectum were photographed. Findings:      The perianal and digital rectal examinations were normal. Pertinent       negatives include normal sphincter tone and no palpable rectal lesions.      Non-bleeding internal hemorrhoids were found during retroflexion. The       hemorrhoids were Grade I (internal hemorrhoids that do not prolapse).      Many small-mouthed diverticula were found in the left colon.      The exam was otherwise without abnormality. Impression:            - Non-bleeding internal hemorrhoids.                        - Diverticulosis in the left colon.                        - The examination was otherwise normal.                        - No specimens collected. Recommendation:        - Patient has a contact number available for                         emergencies. The signs and symptoms of potential                         delayed complications were discussed with the patient.                         Return to normal activities tomorrow. Written                         discharge instructions were provided to the patient.                        -  Resume previous diet.                        - Continue present medications.                        - No repeat colonoscopy due to current age (71 years                         or older) and the absence of colonic polyps.                        - Return to physician assistant in 6 weeks.                        - Telephone GI office to schedule appointment.                        - Await pathology results from EGD, also performed                         today.                        - Perform a barium swallow using  barium in liquid and                         tablet form at appointment to be scheduled. Procedure Code(s):     --- Professional ---                        Y5638, Colorectal cancer screening; colonoscopy on                         individual not meeting criteria for high risk Diagnosis Code(s):     --- Professional ---                        K57.30, Diverticulosis of large intestine without                         perforation or abscess without bleeding                        K64.0, First degree hemorrhoids                        Z12.11, Encounter for screening for malignant neoplasm                         of colon CPT copyright 2019 American [employer]. All rights reserved. The codes documented in this report are preliminary and upon coder review may  be revised to meet current compliance requirements. Efrain Sella MD, MD 05/15/2022 9:44:39 AM This report has been signed electronically. Number of Addenda: 0 Note Initiated On: 05/15/2022 9:06 AM Scope Withdrawal Time: 0 hours 6 minutes 51 seconds  Total Procedure Duration: 0 hours 9 minutes 57 seconds  Estimated Blood Loss:  Estimated blood loss: none.      [facility]

## 2022-05-15 NOTE — Op Note (Signed)
[facility] Gastroenterology Patient Name: James Logan Procedure Date: 05/15/2022 9:09 AM MRN: [id number] Account #: [id number] Date of Birth: [birth year] Admit Type: Outpatient Age: 76 Room: [facility] ENDO ROOM 2 Gender: Male Note Status: Finalized Instrument Name: Upper Endoscope [id number] Procedure:             Upper GI endoscopy Indications:           Pharyngeal phase dysphagia Providers:             Lorie Apley K. Alice Reichert MD, MD Referring MD:          Ocie Cornfield. Ouida Sills MD, MD (Referring MD) Medicines:             Propofol per Anesthesia Complications:         No immediate complications. Procedure:             Pre-Anesthesia Assessment:                        - The risks and benefits of the procedure and the                         sedation options and risks were discussed with the                         patient. All questions were answered and informed                         consent was obtained.                        - Patient identification and proposed procedure were                         verified prior to the procedure by the nurse. The                         procedure was verified in the procedure room.                        - ASA Grade Assessment: III - A patient with severe                         systemic disease.                        - After reviewing the risks and benefits, the patient                         was deemed in satisfactory condition to undergo the                         procedure.                        After obtaining informed consent, the endoscope was                         passed under direct vision. Throughout the procedure,  the patient's blood pressure, pulse, and oxygen                         saturations were monitored continuously. The Endoscope                         was introduced through the mouth, and advanced to the                         third part of duodenum. The upper GI endoscopy was                          accomplished without difficulty. The patient tolerated                         the procedure well. Findings:      Two tongues of salmon-colored mucosa were present from 34 to 36 cm. No       other visible abnormalities were present. The maximum longitudinal       extent of these esophageal mucosal changes was 2 cm in length. Mucosa       was biopsied with a cold forceps for histology. One specimen bottle was       sent to pathology.      There is no endoscopic evidence of esophagitis, hiatal hernia,       inflammation, stenosis, stricture, mass, diverticula or scarring in the       entire esophagus.      The examined duodenum was normal.      The stomach was normal. Impression:            - Salmon-colored mucosa suspicious for short-segment                         Barrett's esophagus. Biopsied.                        - Normal examined duodenum.                        - Normal stomach. Recommendation:        - Await pathology results.                        - Perform a barium swallow using barium in liquid and                         tablet form at appointment to be scheduled.                        - Proceed with colonoscopy Procedure Code(s):     --- Professional ---                        [phone], Esophagogastroduodenoscopy, flexible,                         transoral; with biopsy, single or multiple Diagnosis Code(s):     --- Professional ---                        R13.13, Dysphagia, pharyngeal phase  K22.8, Other specified diseases of esophagus CPT copyright 2019 American Medical Association. All rights reserved. The codes documented in this report are preliminary and upon coder review may  be revised to meet current compliance requirements. Efrain Sella MD, MD 05/15/2022 9:28:12 AM This report has been signed electronically. Number of Addenda: 0 Note Initiated On: 05/15/2022 9:09 AM Estimated Blood Loss:  Estimated blood loss: none.  Estimated blood loss: none.      [facility]

## 2022-05-15 NOTE — Anesthesia Procedure Notes (Signed)
Procedure Name: MAC Date/Time: 05/15/2022 9:03 AM  Performed by: Tollie Eth, CRNAPre-anesthesia Checklist: Patient identified, Emergency Drugs available, Suction available and Patient being monitored Patient Re-evaluated:Patient Re-evaluated prior to induction Oxygen Delivery Method: Nasal cannula Induction Type: IV induction Placement Confirmation: positive ETCO2

## 2022-05-15 NOTE — H&P (Signed)
Outpatient short stay form Pre-procedure 05/15/2022 9:05 AM Maureen Duesing K. Alice Reichert, M.D.  Primary Physician: Frazier Richards, M.D.  Reason for visit:  Dysphagia, colon cancer screening, GERD  History of present illness:    76 y/o Caucasian male with a PMH of HTN, mixed HLD, prediabetes, and longstanding persistent atrial fibrillation on chronic anticoagulation with Eliquis presents to the [place] [facility] for chief complaint of colon cancer screening and pharyngoesophageal dysphagia and GERD with hiatal hernia  1. Pharyngoesophageal dysphagia 2. GERD with hiatal hernia  - Solid food dysphagia symptoms c/w Schatzki's ring, esophageal stricture, Zenker's diverticulum, esophageal dysmotility, esophageal adenocarcinoma, EoE, complex hiatal hernia, other functional GI disorder, etc - Start esomeprazole 40 mg PO daily for gastric protection - Dysphagia precautions reviewed with patient - cut up food well, small bites, chew well - Discussed importance of avoidance of dietary triggers and antireflux precautions - Advise EGD +/- dilatation and esophageal bx at next step in plan of care and he consents to proceed - Add on EGD to his screening colonoscopy    Current Facility-Administered Medications:    0.9 %  sodium chloride infusion, , Intravenous, Continuous, Teighan Aubert, Benay Pike, MD, Last Rate: 20 mL/hr at 05/15/22 0855, 20 mL/hr at 05/15/22 0855  Medications Prior to Admission  Medication Sig Dispense Refill Last Dose   acetaminophen (TYLENOL) 325 MG tablet Take 650 mg by mouth every 6 (six) hours as needed.   05/14/2022   apixaban (ELIQUIS) 5 MG TABS tablet Take 5 mg by mouth 2 (two) times daily.   Past Week   cetirizine (ZYRTEC) 10 MG tablet Take 10 mg by mouth as needed.    05/14/2022   chlorpheniramine-HYDROcodone (TUSSIONEX PENNKINETIC ER) 10-8 MG/5ML SUER Take 5 mLs by mouth 2 (two) times daily. 140 mL 0 05/14/2022   diazepam (VALIUM) 5 MG tablet Take 1 tablet (5 mg total) by mouth every 8  (eight) hours as needed for muscle spasms. 8 tablet 0 05/14/2022   diltiazem (CARDIZEM CD) 120 MG 24 hr capsule Take 120 mg by mouth daily.   05/14/2022   esomeprazole (NEXIUM) 10 MG packet Take 10 mg by mouth daily before breakfast.   05/14/2022   fexofenadine (ALLEGRA) 180 MG tablet Take 180 mg by mouth daily.   05/14/2022   fluticasone (FLONASE) 50 MCG/ACT nasal spray Place 2 sprays into both nostrils daily. 16 g 2 05/14/2022   ivermectin (STROMECTOL) 3 MG TABS tablet Take 5 tablets (15,000 mcg total) by mouth 2 (two) times a week. Take 5 tablets by mouth 3 days from now, followed by 5 tablets 6 days from now if not recovered 10 tablet 0 05/14/2022   lisinopril (PRINIVIL,ZESTRIL) 10 MG tablet Take 10 mg by mouth daily. Dr Ubaldo Glassing   05/14/2022   losartan-hydrochlorothiazide (HYZAAR) 100-12.5 MG tablet Take 1 tablet by mouth daily.   05/14/2022   meloxicam (MOBIC) 15 MG tablet Take 1 tablet (15 mg total) by mouth daily. 90 tablet 1 05/14/2022   predniSONE (DELTASONE) 50 MG tablet Take 1 tablet by mouth daily 4 tablet 0 05/14/2022   terbinafine (LAMISIL) 250 MG tablet Take 250 mg by mouth daily.   05/14/2022   zaleplon (SONATA) 5 MG capsule Take 5 mg by mouth at bedtime as needed for sleep.   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    Physical Exam  Gen: Alert, oriented. Appears stated age.  HEENT: [place]/AT. PERRLA. Lungs: CTA, no wheezes. CV: RR nl S1, S2. Abd: soft, benign, no masses. BS+ Ext: No edema. Pulses 2+    Planned procedures: Proceed with EGD and colonoscopy. The  patient understands the nature of the planned procedure, indications, risks, alternatives and potential complications including but not limited to bleeding, infection, perforation, damage to internal organs and possible oversedation/side effects from anesthesia. The patient agrees and gives consent to proceed.  Please refer to procedure notes for findings, recommendations and patient disposition/instructions.     Claryce Friel K. Alice Reichert, M.D. Gastroenterology 05/15/2022  9:05 AM

## 2022-05-15 NOTE — Interval H&P Note (Signed)
History and Physical Interval Note:  05/15/2022 9:06 AM  James Logan  has presented today for surgery, with the diagnosis of [place].  The various methods of treatment have been discussed with the patient and family. After consideration of risks, benefits and other options for treatment, the patient has consented to  Procedure(s): COLONOSCOPY (N/A) ESOPHAGOGASTRODUODENOSCOPY (EGD) (N/A) as a surgical intervention.  The patient's history has been reviewed, patient examined, no change in status, stable for surgery.  I have reviewed the patient's chart and labs.  Questions were answered to the patient's satisfaction.     [place], [place]

## 2022-05-15 NOTE — Transfer of Care (Signed)
Immediate Anesthesia Transfer of Care Note  Patient: James Logan [facility]  Procedure(s) Performed: COLONOSCOPY ESOPHAGOGASTRODUODENOSCOPY (EGD)  Patient Location: Endoscopy Unit  Anesthesia Type:General  Level of Consciousness: drowsy  Airway & Oxygen Therapy: Patient Spontanous Breathing  Post-op Assessment: Report given to RN and Post -op Vital signs reviewed and stable  Post vital signs: Reviewed and stable  Last Vitals:  Vitals Value Taken Time  BP 108/65 05/15/22 0944  Temp 35.7 C 05/15/22 0944  Pulse 67 05/15/22 0946  Resp 11 05/15/22 0946  SpO2 99 % 05/15/22 0946  Vitals shown include unvalidated device data.  Last Pain:  Vitals:   05/15/22 0944  TempSrc: Oral  PainSc: Asleep         Complications: No notable events documented.

## 2022-05-15 NOTE — Anesthesia Preprocedure Evaluation (Addendum)
Anesthesia Evaluation  Patient identified by MRN, date of birth, ID band Patient awake    Reviewed: Allergy & Precautions, NPO status , Patient's Chart, lab work & pertinent test results  History of Anesthesia Complications Negative for: history of anesthetic complications  Airway Mallampati: II  TM Distance: >3 FB Neck ROM: Full    Dental  (+) Implants   Pulmonary asthma , sleep apnea , neg COPD, Patient abstained from smoking.Not current smoker, former smoker,    Pulmonary exam normal breath sounds clear to auscultation       Cardiovascular Exercise Tolerance: Good METShypertension, Pt. on medications (-) CAD and (-) Past MI + dysrhythmias Atrial Fibrillation  Rhythm:Regular Rate:Normal - Systolic murmurs Echo 28/00/34: EF = 40% INTERPRETATION MILD LV SYSTOLIC DYSFUNCTION WITH AN ESTIMATED EF = 40% NORMAL RIGHT VENTRICULAR SYSTOLIC FUNCTION MILD-TO-MODERATE TRICUSPID AND MITRAL VALVE INSUFFICIENCY MILD AORTIC VALVE INSUFFICIENCY NO VALVULAR STENOSIS MILD BIATRIAL ENLARGEMENT MILD LVH    Neuro/Psych PSYCHIATRIC DISORDERS Depression negative neurological ROS     GI/Hepatic hiatal hernia, GERD  Medicated and Controlled,(+)     (-) substance abuse  ,   Endo/Other  neg diabetes  Renal/GU negative Renal ROS     Musculoskeletal   Abdominal   Peds  Hematology   Anesthesia Other Findings Past Medical History: No date: Allergy     Comment:  always has trouble with sinuses No date: Arthritis No date: Asthma     Comment:  as a child No date: Atrial fibrillation, persistent (HCC) No date: Depression No date: Hyperlipidemia No date: Hypertension No date: Incisional hernia     Comment:  ventral No date: 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 No date: Pneumonia     Comment:  hx of pneumonia No date: Pre-diabetes No date: Sleep apnea No date: Urinary calculi  Reproductive/Obstetrics                             Anesthesia Physical Anesthesia Plan  ASA: 3  Anesthesia Plan: General   Post-op Pain Management: Minimal or no pain anticipated   Induction: Intravenous  PONV Risk Score and Plan: 2 and Propofol infusion, TIVA and Ondansetron  Airway Management Planned: Nasal Cannula  Additional Equipment: None  Intra-op Plan:   Post-operative Plan:   Informed Consent: I have reviewed the patients History and Physical, chart, labs and discussed the procedure including the risks, benefits and alternatives for the proposed anesthesia with the patient or authorized representative who has indicated his/her understanding and acceptance.     Dental advisory given  Plan Discussed with: CRNA and Surgeon  Anesthesia Plan Comments: (Discussed risks of anesthesia with patient, including possibility of difficulty with spontaneous ventilation under anesthesia necessitating airway intervention, PONV, and rare risks such as cardiac or respiratory or neurological events, and allergic reactions. Discussed the role of CRNA in patient's perioperative care. Patient understands.)        Anesthesia Quick Evaluation

## 2022-05-16 LAB — SURGICAL PATHOLOGY

## 2022-05-27 ENCOUNTER — Other Ambulatory Visit: Payer: Self-pay | Admitting: Gastroenterology

## 2022-05-27 DIAGNOSIS — R1314 Dysphagia, pharyngoesophageal phase: Secondary | ICD-10-CM

## 2022-06-03 ENCOUNTER — Ambulatory Visit
Admission: RE | Admit: 2022-06-03 | Discharge: 2022-06-03 | Disposition: A | Discharge status: Home / Self Care | Payer: Medicare PPO | Source: Ambulatory Visit | Attending: Gastroenterology | Admitting: Gastroenterology

## 2022-06-03 DIAGNOSIS — R1314 Dysphagia, pharyngoesophageal phase: Secondary | ICD-10-CM | POA: Insufficient documentation

## 2024-04-05 ENCOUNTER — Inpatient Hospital Stay
Admission: EM | Admit: 2024-04-05 | Discharge: 2024-04-10 | DRG: 389 | Disposition: A | Discharge status: Home / Self Care | Attending: Internal Medicine | Admitting: Internal Medicine

## 2024-04-05 ENCOUNTER — Emergency Department

## 2024-04-05 ENCOUNTER — Other Ambulatory Visit: Payer: Self-pay

## 2024-04-05 DIAGNOSIS — F32A Depression, unspecified: Secondary | ICD-10-CM | POA: Diagnosis present

## 2024-04-05 DIAGNOSIS — N400 Enlarged prostate without lower urinary tract symptoms: Secondary | ICD-10-CM | POA: Diagnosis present

## 2024-04-05 DIAGNOSIS — Z7901 Long term (current) use of anticoagulants: Secondary | ICD-10-CM

## 2024-04-05 DIAGNOSIS — R7303 Prediabetes: Secondary | ICD-10-CM | POA: Diagnosis present

## 2024-04-05 DIAGNOSIS — Z87891 Personal history of nicotine dependence: Secondary | ICD-10-CM | POA: Diagnosis not present

## 2024-04-05 DIAGNOSIS — Z9081 Acquired absence of spleen: Secondary | ICD-10-CM | POA: Diagnosis not present

## 2024-04-05 DIAGNOSIS — Z7952 Long term (current) use of systemic steroids: Secondary | ICD-10-CM | POA: Diagnosis not present

## 2024-04-05 DIAGNOSIS — T380X5A Adverse effect of glucocorticoids and synthetic analogues, initial encounter: Secondary | ICD-10-CM | POA: Diagnosis present

## 2024-04-05 DIAGNOSIS — I4819 Other persistent atrial fibrillation: Secondary | ICD-10-CM | POA: Diagnosis present

## 2024-04-05 DIAGNOSIS — Z79899 Other long term (current) drug therapy: Secondary | ICD-10-CM | POA: Diagnosis not present

## 2024-04-05 DIAGNOSIS — Z981 Arthrodesis status: Secondary | ICD-10-CM

## 2024-04-05 DIAGNOSIS — Z96642 Presence of left artificial hip joint: Secondary | ICD-10-CM | POA: Diagnosis present

## 2024-04-05 DIAGNOSIS — I10 Essential (primary) hypertension: Secondary | ICD-10-CM | POA: Diagnosis present

## 2024-04-05 DIAGNOSIS — Z8701 Personal history of pneumonia (recurrent): Secondary | ICD-10-CM

## 2024-04-05 DIAGNOSIS — Z791 Long term (current) use of non-steroidal anti-inflammatories (NSAID): Secondary | ICD-10-CM

## 2024-04-05 DIAGNOSIS — J329 Chronic sinusitis, unspecified: Secondary | ICD-10-CM | POA: Diagnosis present

## 2024-04-05 DIAGNOSIS — J45909 Unspecified asthma, uncomplicated: Secondary | ICD-10-CM | POA: Diagnosis present

## 2024-04-05 DIAGNOSIS — E876 Hypokalemia: Secondary | ICD-10-CM | POA: Diagnosis not present

## 2024-04-05 DIAGNOSIS — I482 Chronic atrial fibrillation, unspecified: Secondary | ICD-10-CM | POA: Diagnosis present

## 2024-04-05 DIAGNOSIS — K56609 Unspecified intestinal obstruction, unspecified as to partial versus complete obstruction: Secondary | ICD-10-CM | POA: Diagnosis present

## 2024-04-05 DIAGNOSIS — N200 Calculus of kidney: Secondary | ICD-10-CM | POA: Diagnosis present

## 2024-04-05 DIAGNOSIS — E785 Hyperlipidemia, unspecified: Secondary | ICD-10-CM | POA: Diagnosis present

## 2024-04-05 DIAGNOSIS — K5651 Intestinal adhesions [bands], with partial obstruction: Principal | ICD-10-CM | POA: Diagnosis present

## 2024-04-05 DIAGNOSIS — D72829 Elevated white blood cell count, unspecified: Secondary | ICD-10-CM | POA: Diagnosis present

## 2024-04-05 DIAGNOSIS — J069 Acute upper respiratory infection, unspecified: Secondary | ICD-10-CM | POA: Diagnosis present

## 2024-04-05 LAB — COMPREHENSIVE METABOLIC PANEL WITH GFR
ALT: 28 U/L (ref 0–44)
AST: 25 U/L (ref 15–41)
Albumin: 4.5 g/dL (ref 3.5–5.0)
Alkaline Phosphatase: 59 U/L (ref 38–126)
Anion gap: 15 (ref 5–15)
BUN: 36 mg/dL — ABNORMAL HIGH (ref 8–23)
CO2: 24 mmol/L (ref 22–32)
Calcium: 9.8 mg/dL (ref 8.9–10.3)
Chloride: 98 mmol/L (ref 98–111)
Creatinine, Ser: 1.2 mg/dL (ref 0.61–1.24)
GFR, Estimated: >60 mL/min (ref >60)
Glucose, Bld: 163 mg/dL — ABNORMAL HIGH (ref 70–99)
Potassium: 4 mmol/L (ref 3.5–5.1)
Sodium: 137 mmol/L (ref 135–145)
Total Bilirubin: 1.4 mg/dL — ABNORMAL HIGH (ref 0.0–1.2)
Total Protein: 8.1 g/dL (ref 6.5–8.1)

## 2024-04-05 LAB — LIPASE, BLOOD: Lipase: 25 U/L (ref 11–51)

## 2024-04-05 LAB — CBC
HCT: 53 % — ABNORMAL HIGH (ref 39.0–52.0)
Hemoglobin: 19.1 g/dL — ABNORMAL HIGH (ref 13.0–17.0)
MCH: 33.8 pg (ref 26.0–34.0)
MCHC: 36 g/dL (ref 30.0–36.0)
MCV: 93.8 fL (ref 80.0–100.0)
Platelets: 358 10*3/uL (ref 150–400)
RBC: 5.65 MIL/uL (ref 4.22–5.81)
RDW: 13.2 % (ref 11.5–15.5)
WBC: 19.2 10*3/uL — ABNORMAL HIGH (ref 4.0–10.5)
nRBC: 0 % (ref 0.0–0.2)

## 2024-04-05 MED ORDER — MORPHINE SULFATE (PF) 4 MG/ML IV SOLN
4.0000 mg | Freq: Once | INTRAVENOUS | Status: AC
  Administered 2024-04-05: 4 mg via INTRAVENOUS
  Filled 2024-04-05: qty 1

## 2024-04-05 MED ORDER — ALBUTEROL SULFATE (2.5 MG/3ML) 0.083% IN NEBU: 2.5000 mg | INHALATION_SOLUTION | RESPIRATORY_TRACT | Status: DC | PRN

## 2024-04-05 MED ORDER — IOHEXOL 300 MG/ML  SOLN
100.0000 mL | Freq: Once | INTRAMUSCULAR | Status: AC | PRN
  Administered 2024-04-05: 100 mL via INTRAVENOUS

## 2024-04-05 MED ORDER — MORPHINE SULFATE (PF) 2 MG/ML IV SOLN: 1.0000 mg | INTRAVENOUS | Status: DC | PRN

## 2024-04-05 MED ORDER — SODIUM CHLORIDE 0.9 % IV SOLN: INTRAVENOUS | Status: AC

## 2024-04-05 MED ORDER — HYDRALAZINE HCL 20 MG/ML IJ SOLN: 5.0000 mg | INTRAMUSCULAR | Status: DC | PRN

## 2024-04-05 MED ORDER — ONDANSETRON HCL 4 MG/2ML IJ SOLN: 4.0000 mg | Freq: Three times a day (TID) | INTRAMUSCULAR | Status: DC | PRN

## 2024-04-05 MED ORDER — DM-GUAIFENESIN ER 30-600 MG PO TB12: 1.0000 | ORAL_TABLET | Freq: Two times a day (BID) | ORAL | Status: DC | PRN

## 2024-04-05 MED ORDER — ONDANSETRON HCL 4 MG/2ML IJ SOLN
4.0000 mg | Freq: Once | INTRAMUSCULAR | Status: AC
  Administered 2024-04-05: 4 mg via INTRAVENOUS
  Filled 2024-04-05: qty 2

## 2024-04-05 MED ORDER — LACTATED RINGERS IV BOLUS
1000.0000 mL | Freq: Once | INTRAVENOUS | Status: AC
  Administered 2024-04-05: 1000 mL via INTRAVENOUS

## 2024-04-05 MED ORDER — OXYCODONE-ACETAMINOPHEN 5-325 MG PO TABS
1.0000 | ORAL_TABLET | ORAL | Status: DC | PRN
  Administered 2024-04-10: 1 via ORAL
  Filled 2024-04-05: qty 1

## 2024-04-05 MED ORDER — ACETAMINOPHEN 325 MG PO TABS
650.0000 mg | ORAL_TABLET | Freq: Four times a day (QID) | ORAL | Status: DC | PRN
  Administered 2024-04-07: 650 mg via ORAL
  Filled 2024-04-05: qty 2

## 2024-04-05 NOTE — ED Notes (Signed)
 Pt taken to CT.

## 2024-04-05 NOTE — ED Notes (Signed)
 PT recently been having abdominal pain, N/V. Pt had sinus infection and was recently prescribed Amoxicillin . 3 days into course, pt started feeling worse, had not had a bowel movement since Fridayand today has been vomiting several times, liquid that is brown is color. Pt reports vomiting at least 8 times today. Pt has tried to eat but would vomit roughly an hour later. Pt ABCs intact. RR even and unlabored. Pt in NAD. Bed in lowest locked position. Call bell in reach. Denies needs at this time.    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

## 2024-04-05 NOTE — ED Triage Notes (Signed)
 Pt reports recent URI and had been taking abx and prednisone , pt reports for the past 3 days he has been constipated with no relief at home with Miralax. Pt reports he has also been vomiting.

## 2024-04-05 NOTE — ED Notes (Signed)
 Pt back from CT at this time

## 2024-04-05 NOTE — ED Provider Notes (Signed)
 [facility] Provider Note    Event Date/Time   First MD Initiated Contact with Patient 04/05/24 2128     (approximate)   History   Constipation   HPI  James Logan is a 78 y.o. male who presents to the ED for evaluation of Constipation   I reviewed PCP visit from a few days ago, 5/28.  Seen for a worsening URI and prescribed a weeklong steroid taper as well as a week of Levaquin . 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  Patient presents to the ED alongside his wife and sister-in-law for evaluation of abdominal pain, bloating, lack of stool, emesis over the past 3 days.  No fevers   Physical Exam   Triage Vital Signs: ED Triage Vitals  Encounter Vitals Group     BP 04/05/24 2043 (!) 161/97     Systolic BP Percentile --      Diastolic BP Percentile --      Pulse Rate 04/05/24 2043 87     Resp 04/05/24 2043 18     Temp 04/05/24 2043 (!) 97.1 F (36.2 C)     Temp Source 04/05/24 2044 Oral     SpO2 04/05/24 2043 95 %     Weight 04/05/24 2043 158 lb (71.7 kg)     Height 04/05/24 2043 5\' 8"  (1.727 m)     Head Circumference --      Peak Flow --      Pain Score 04/05/24 2043 6     Pain Loc --      Pain Education --      Exclude from Growth Chart --     Most recent vital signs: Vitals:   04/05/24 2044 04/05/24 2139  BP:  (!) 153/114  Pulse:  99  Resp:  20  Temp: 97.9 F (36.6 C) 97.8 F (36.6 C)  SpO2:  95%    General: Awake, no distress.  Looks uncomfortable, holding an emesis bag CV:  Good peripheral perfusion.  Resp:  Normal effort.  Abd:  Mild distention, diffusely tender without peritoneal features. MSK:  No deformity noted.  Neuro:  No focal deficits appreciated. Other:     ED Results / Procedures / Treatments   Labs (all labs ordered are listed, but only abnormal results are displayed) Labs Reviewed  COMPREHENSIVE METABOLIC PANEL WITH GFR -  Abnormal; Notable for the following components:      Result Value   Glucose, Bld 163 (*)    BUN 36 (*)    Total Bilirubin 1.4 (*)    All other components within normal limits  CBC - Abnormal; Notable for the following components:   WBC 19.2 (*)    Hemoglobin 19.1 (*)    HCT 53.0 (*)    All other components within normal limits  LIPASE, BLOOD  URINALYSIS, ROUTINE W REFLEX MICROSCOPIC    EKG   RADIOLOGY Plain film of the abdomen interpreted by me with no free air CT interpreted by me with signs of an SBO  Official radiology report(s): CT ABDOMEN PELVIS W CONTRAST Result Date: 04/05/2024 CLINICAL DATA:  Vomiting. Bloating, constipation, pain. Evaluate for bowel obstruction. EXAM: CT ABDOMEN AND PELVIS WITH CONTRAST TECHNIQUE: Multidetector CT imaging of the abdomen and pelvis was performed using the standard protocol following bolus administration of intravenous contrast. RADIATION DOSE REDUCTION: This exam was performed according to the departmental dose-optimization program which includes automated exposure control, adjustment of the mA  and/or kV according to patient size and/or use of iterative reconstruction technique. CONTRAST:  100mL OMNIPAQUE IOHEXOL 300 MG/ML  SOLN COMPARISON:  CT 10/10/2017 FINDINGS: Lower chest: The heart is mildly enlarged. No basilar airspace disease or pleural effusion. Hepatobiliary: Mild diffuse hepatic steatosis. No focal liver lesion. Punctate gallstones, series 2, image 32. No pericholecystic inflammation or abnormal gallbladder distension. No biliary dilatation. Pancreas: Mild fatty atrophy.  No ductal dilatation or inflammation. Spleen: Splenectomy. Adrenals/Urinary Tract: No adrenal nodule. Nonobstructing 7 mm stone in the lower left kidney. No hydronephrosis. No renal inflammation. Partially distended urinary bladder, normal for degree of distension. Stomach/Bowel: Dilated fluid-filled stomach. Dilated fluid-filled proximal small bowel measuring up to 4.3 cm  diameter. Transition point from dilated to nondilated in the left mid abdomen, series 5, image 18. The small bowel distal to this is decompressed. Mild mesenteric edema in the region of small bowel dilatation. No pneumatosis. The appendix is normal. Scattered colonic diverticula without diverticulitis. Small volume of formed stool in the colon. Vascular/Lymphatic: Aortic atherosclerosis. No aortic aneurysm. Portal vein is patent. No portal venous or mesenteric gas. No lymphadenopathy. Reproductive: Prominent prostate spans 4.6 cm. Other: No free air or ascites. Minimal fat in the inguinal canals. Post abdominal wall hernia repair. Small peripherally calcified structure deep to the area of surgery is likely an area of prior fat necrosis. Musculoskeletal: The bones are subjectively under mineralized. Moderate right hip osteoarthritis. Left hip arthroplasty. No acute osseous findings. IMPRESSION: 1. Small-bowel obstruction with transition point in the left mid abdomen. Mild mesenteric edema in the region of small bowel dilatation. No pneumatosis or free air. 2. Hepatic steatosis. 3. Cholelithiasis without cholecystitis. 4. Nonobstructing left renal stone. 5. Colonic diverticulosis without diverticulitis. Aortic Atherosclerosis (ICD10-I70.0). Electronically Signed   By: Chadwick Colonel M.D.   On: 04/05/2024 22:35   DG Abdomen 1 View Result Date: 04/05/2024 CLINICAL DATA:  Constipation EXAM: ABDOMEN - 1 VIEW COMPARISON:  None Available. FINDINGS: Nonobstructive bowel gas pattern. Moderate to large volume stool. No organomegaly. 7 mm calculus overlies the expected lower pole of the left kidney. Rounded calculus within right mid abdomen corresponds to a extraluminal calculus within mesenteric fat on prior CT examination of 10/10/2017. Degenerative changes are seen within the lumbar spine right hip. Left total hip arthroplasty has been performed. IMPRESSION: 1. Moderate to large volume stool. 2. 7 mm left  nephrolithiasis. Electronically Signed   By: Worthy Heads M.D.   On: 04/05/2024 21:13    PROCEDURES and INTERVENTIONS:  Procedures  Medications  lactated ringers  bolus 1,000 mL (1,000 mLs Intravenous New Bag/Given 04/05/24 2202)  morphine (PF) 4 MG/ML injection 4 mg (4 mg Intravenous Given 04/05/24 2202)  ondansetron  (ZOFRAN ) injection 4 mg (4 mg Intravenous Given 04/05/24 2201)  iohexol (OMNIPAQUE) 300 MG/ML solution 100 mL (100 mLs Intravenous Contrast Given 04/05/24 2220)     IMPRESSION / MDM / ASSESSMENT AND PLAN / ED COURSE  I reviewed the triage vital signs and the nursing notes.  Differential diagnosis includes, but is not limited to, SBO, ileus, constipation  {Patient presents with symptoms of an acute illness or injury that is potentially life-threatening.  Patient presents with abdominal pain, bloating, lack of stool and emesis concerning for an SBO.  Leukocytosis is noted, likely consequence of his course of prednisone .  No clear infectious etiology of his symptoms as he is also concurrently taking Levaquin .  Metabolic panel with no significant pathology, normal lipase.  Nondiagnostic KUB.  Will obtain a CT due to clinical suspicion  for SBO or more severe pathology than slow transit constipation..    Clinical Course as of 04/05/24 2308  Mon Apr 05, 2024  2257 Updated patient and family of CT results, recommend NG tube and he is agreeable.  We will consult with medicine for admission [DS]    Clinical Course User Index [DS] Arline Bennett, MD     FINAL CLINICAL IMPRESSION(S) / ED DIAGNOSES   Final diagnoses:  SBO (small bowel obstruction) (HCC)     Rx / DC Orders   ED Discharge Orders     None        Note:  This document was prepared using Dragon voice recognition software and may include unintentional dictation errors.   Arline Bennett, MD 04/05/24 [phone]

## 2024-04-05 NOTE — H&P (Signed)
 History and Physical    James Logan ZOX:[id number] DOB: October 12, 1946 DOA: 04/05/2024  Referring MD/NP/PA:   PCP: James Moulding, MD   Patient coming from:  The patient is coming from home.     Chief Complaint: Constipation, abdominal distention, abdominal pain, nausea, vomiting  HPI: James Logan is a 78 y.o. male with medical history significant of hernia repair, HTN, HLD, prediabetes, childhood asthma, depression, BPH, GERD, A-fib on Eliquis, kidney stone, hiatal hernia, who presents with constipation, abdominal distention, abdominal pain, nausea, vomiting.  Patient states he has been constipated in the past 3 days.  Last bowel movement was on Friday.  He has abdominal distention, diffuse abdominal pain, nausea, vomiting, no diarrhea.  His abdominal pain is constant, aching, mild to moderate, nonradiating, not aggravated or alleviated by any known factors.  No fever or chills.  No symptoms of UTI.  No chest pain, cough, SOB.  Of note, patient was recently treated for sinus infection with amoxicillin  with improvement, then symptoms came back.  PCP started him on Levaquin  and prednisone  tapering on 5/28.  His symptoms have significantly improved.  He stopped taking these medication since he has abdominal distention and pain.  He does not want to restart prednisone  and Levaquin  today.  Patient states that he took Eliquis yesterday morning.  Data reviewed independently and ED Course: pt was found to have WBC 19.2, GFR> 60, temperature normal, blood pressure 153/114, heart rate 99, RR 20, oxygen saturation 95% on room air.  Patient is admitted to tele bed as inpatient. Consulted James. Cornel Logan of surgery.  CT-abdomen/pelvis 1. Small-bowel obstruction with transition point in the left mid abdomen. Mild mesenteric edema in the region of small bowel dilatation. No pneumatosis or free air. 2. Hepatic steatosis. 3. Cholelithiasis without cholecystitis. 4. Nonobstructing left renal stone. 5.  Colonic diverticulosis without diverticulitis.   Aortic Atherosclerosis (ICD10-I70.0).    EKG: lot done in ED, will get one.     Review of Systems:   General: no fevers, chills, no body weight gain, has poor appetite. HEENT: no blurry vision, hearing changes or sore throat Respiratory: no dyspnea, coughing, wheezing CV: no chest pain, no palpitations GI: has nausea, vomiting, abdominal pain, constipation GU: no dysuria, burning on urination, increased urinary frequency, hematuria  Ext: no leg edema Neuro: no unilateral weakness, numbness, or tingling, no vision change or hearing loss Skin: no rash, no skin tear. MSK: No muscle spasm, no deformity, no limitation of range of movement in spin Heme: No easy bruising.  Travel history: No recent long distant travel.   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    Pneumonia    hx of pneumonia   Pre-diabetes    Sleep apnea    Urinary calculi     Past Surgical History:  Procedure Laterality Date   ANTERIOR CERVICAL DECOMP/DISCECTOMY FUSION N/A 09/17/2013   Procedure: ANTERIOR CERVICAL DECOMPRESSION/DISCECTOMY FUSION 2 LEVELS;  Surgeon: James Bosch, MD;  Location: [facility];  Service: Neurosurgery;  Laterality: N/A;   COLONOSCOPY  07/2011   normal - 10 yr follow up   COLONOSCOPY N/A 05/15/2022  Procedure: COLONOSCOPY;  Surgeon: Toledo, Alphonsus Jeans, MD;  Location: [facility];  Service: Gastroenterology;  Laterality: N/A;   ELBOW SURGERY Right 07/25/2011   tendon repair   ESOPHAGOGASTRODUODENOSCOPY N/A 05/15/2022   Procedure: ESOPHAGOGASTRODUODENOSCOPY (EGD);  Surgeon: Toledo, Alphonsus Jeans, MD;  Location: [facility];  Service:  Gastroenterology;  Laterality: N/A;   HERNIA REPAIR  11/23/2012   kidney stones     surgery, stone to large to pass   KNEE ARTHROSCOPY W/ ACL RECONSTRUCTION Right 2000's   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 His smoking use included cigarettes. He started smoking about 63 years ago. He has a 5 pack-year smoking history. He has never used smokeless tobacco. He reports that he does not drink alcohol and does not use drugs.  Family History:  Family History  Problem Relation Age of Onset   Cancer Maternal Grandmother    Cancer Paternal Grandmother      Prior to Admission medications   Medication Sig Start Date End Date Taking? Authorizing Provider  acetaminophen  (TYLENOL ) 325 MG tablet Take 650 mg by mouth every 6 (six) hours as needed.    [provider]  apixaban (ELIQUIS) 5 MG TABS tablet Take 5 mg by mouth 2 (two) times daily.    [provider]  cetirizine (ZYRTEC) 10 MG tablet Take 10 mg by mouth as needed.     [provider]  chlorpheniramine-HYDROcodone  (TUSSIONEX PENNKINETIC ER) 10-8 MG/5ML SUER Take 5 mLs by mouth 2 (two) times daily. 11/06/19   James Demark, MD  diazepam  (VALIUM ) 5 MG tablet Take 1 tablet (5 mg total) by mouth every 8 (eight) hours as needed for muscle spasms. 10/10/17   James Maudlin, MD  diltiazem  (CARDIZEM  CD) 120 MG 24 hr capsule Take 120 mg by mouth daily.    [provider]  esomeprazole (NEXIUM) 10 MG packet Take 10 mg by mouth daily before breakfast.    [provider]  fexofenadine (ALLEGRA) 180 MG tablet Take 180 mg by mouth daily.    [provider]  fluticasone  (FLONASE ) 50 MCG/ACT nasal spray Place 2 sprays into both nostrils daily. 10/10/14   James Flax, MD  ivermectin  (STROMECTOL ) 3 MG TABS tablet Take 5 tablets (15,000 mcg total) by mouth 2  (two) times a week. Take 5 tablets by mouth 3 days from now, followed by 5 tablets 6 days from now if not recovered 11/08/19   James Demark, MD  lisinopril (PRINIVIL,ZESTRIL) 10 MG tablet Take 10 mg by mouth daily. James Cara Chancellor    [provider]  losartan-hydrochlorothiazide (HYZAAR) 100-12.5 MG tablet Take 1 tablet by mouth daily.    [provider]  meloxicam  (MOBIC ) 15 MG tablet Take 1 tablet (15 mg total) by mouth daily. 02/12/17   Sheron Dixons, MD  predniSONE  (DELTASONE ) 50 MG tablet Take 1 tablet by mouth daily 11/06/19   Williams, Jonathan E, MD  terbinafine (LAMISIL) 250 MG tablet Take 250 mg by mouth daily.    [provider]  zaleplon (SONATA) 5 MG capsule Take 5 mg by mouth at bedtime as needed for sleep.    [provider]    Physical Exam: Vitals:   04/05/24 2043 04/05/24 2044 04/05/24 2139 04/06/24 0037  BP: (!) 161/97  (!) 153/114 (!) 152/99  Pulse: 87  99 74  Resp: 18  20 20   Temp: (!) 97.1 F (36.2 C) 97.9 F (36.6 C) 97.8 F (36.6 C) 98.4 F (36.9 C)  TempSrc:  Oral Oral Oral  SpO2: 95%  95% 94%  Weight: 71.7 kg     Height: 5\' 8"  (1.727 m)      General: Not in acute distress HEENT:       Eyes: PERRL, EOMI, no jaundice       ENT: No discharge from the ears and nose, no pharynx injection, no tonsillar enlargement.        Neck: No JVD, no bruit, no mass felt. Heme: No neck lymph node enlargement. Cardiac: S1/S2, irregularly irregular rhythm, no murmurs, No gallops or rubs. Respiratory: No rales, wheezing, rhonchi or rubs. GI: distended with diffused tenderness, no rebound pain, no organomegaly, BS present. GU: No hematuria Ext: No pitting leg edema bilaterally. 1+DP/PT pulse bilaterally. Musculoskeletal: No joint deformities, No joint redness or warmth, no limitation of ROM in spin. Skin: No rashes.  Neuro: Alert, oriented X3, cranial nerves II-XII grossly intact, moves all extremities normally. Psych: Patient is not  psychotic, no suicidal or hemocidal ideation.  Labs on Admission: I have personally reviewed following labs and imaging studies  CBC: Recent Labs  Lab 04/05/24 2050  WBC 19.2*  HGB 19.1*  HCT 53.0*  MCV 93.8  PLT 358   Basic Metabolic Panel: Recent Labs  Lab 04/05/24 2050  NA 137  K 4.0  CL 98  CO2 24  GLUCOSE 163*  BUN 36*  CREATININE 1.20  CALCIUM 9.8   GFR: Estimated Creatinine Clearance: 49.1 mL/min (by C-G formula based on SCr of 1.2 mg/dL). Liver Function Tests: Recent Labs  Lab 04/05/24 2050  AST 25  ALT 28  ALKPHOS 59  BILITOT 1.4*  PROT 8.1  ALBUMIN 4.5   Recent Labs  Lab 04/05/24 2050  LIPASE 25   No results for input(s): "AMMONIA" in the last 168 hours. Coagulation Profile: Recent Labs  Lab 04/06/24 0116  INR 1.1   Cardiac Enzymes: No results for input(s): "CKTOTAL", "CKMB", "CKMBINDEX", "TROPONINI" in the last 168 hours. BNP (last 3 results) No results for input(s): "PROBNP" in the last 8760 hours. HbA1C: No results for input(s): "HGBA1C" in the last 72 hours. CBG: No results for input(s): "GLUCAP" in the last 168 hours. Lipid Profile: No results for input(s): "CHOL", "HDL", "LDLCALC", "TRIG", "CHOLHDL", "LDLDIRECT" in the last 72 hours. Thyroid  Function Tests: No results for input(s): "TSH", "T4TOTAL", "FREET4", "T3FREE", "THYROIDAB" in the last 72 hours. Anemia Panel: No results for input(s): "VITAMINB12", "FOLATE", "FERRITIN", "TIBC", "IRON", "RETICCTPCT" in the last 72 hours. Urine analysis:    Component Value Date/Time   COLORURINE YELLOW (A) 10/10/2017 1522   APPEARANCEUR CLEAR (A) 10/10/2017 1522   APPEARANCEUR Clear 11/16/2014 1001   LABSPEC 1.026 10/10/2017 1522   LABSPEC 1.005 11/16/2014 1001   PHURINE 5.0 10/10/2017 1522   GLUCOSEU NEGATIVE 10/10/2017 1522   GLUCOSEU Negative 11/16/2014 1001   HGBUR NEGATIVE 10/10/2017 1522   BILIRUBINUR neg 11/17/2018 1135   BILIRUBINUR Negative 11/16/2014 1001   KETONESUR 20 (A)  10/10/2017 1522   PROTEINUR Negative 11/17/2018 1135   PROTEINUR 30 (A) 10/10/2017 1522   UROBILINOGEN 0.2 11/17/2018 1135   NITRITE neg 11/17/2018 1135   NITRITE NEGATIVE 10/10/2017 1522   LEUKOCYTESUR Negative 11/17/2018 1135   LEUKOCYTESUR Negative 11/16/2014 1001   Sepsis Labs: @LABRCNTIP (procalcitonin:4,lacticidven:4) )No results found for this or any previous visit (from the past 240 hours).   Radiological Exams  on Admission:   Assessment/Plan Principal Problem:   SBO (small bowel obstruction) (HCC) Active Problems:   Chronic atrial fibrillation   HTN (hypertension)   Prediabetes   Recurrent sinusitis   Leukocytosis   Assessment and Plan:   SBO (small bowel obstruction) (HCC): CT showed SOB with transition point in the left mid abdomen, and mild mesenteric edema in the region of small bowel dilatation. Consulted James. Cornel Logan of surgery.  -Admit to tele bed as inpt -NPO   -NG tube  --> keep NG tube on low intermittent suctioning  -morphine, percocet prn pain -Prn Zofran  prn nausea   -IVF: 1L bolus and then NS 75 cc/h -INR/PTT/type & screen -Follow-up general surgeons recommendation  Chronic atrial fibrillation: Heart rate 90s -IV as needed metoprolol 2.5 mg every 2 hour for heart rate> 125 - Continue home cardia - Hold Eliquis in case patient needed surgery  HTN (hypertension) -IV hydralazine as needed - Cardia  Prediabetes: Recent A1c 5.7, well-controlled.  Patient is not taking medications. -Check CBG every morning  Recurrent sinusitis: Symptoms have almost resolved.  Patient does not want to continue steroid and Levaquin . - As needed Mucinex  and albuterol  Leukocytosis: WBC 19.2, no fever, likely due to recent steroid use. -Follow CBC     DVT ppx: SCD  Code Status: Full code    Family Communication:     not done, no family member is at bed side.       Disposition Plan:  Anticipate discharge back to previous environment  Consults called:   Consulted James. Cornel Logan of surgery.   Admission status and Level of care:  as inpt        Dispo: The patient is from: Home              Anticipated d/c is to: Home              Anticipated d/c date is: 2 days              Patient currently is not medically stable to d/c.    Severity of Illness:  The appropriate patient status for this patient is INPATIENT. Inpatient status is judged to be reasonable and necessary in order to provide the required intensity of service to ensure the patient's safety. The patient's presenting symptoms, physical exam findings, and initial radiographic and laboratory data in the context of their chronic comorbidities is felt to place them at high risk for further clinical deterioration. Furthermore, it is not anticipated that the patient will be medically stable for discharge from the hospital within 2 midnights of admission.   * I certify that at the point of admission it is my clinical judgment that the patient will require inpatient hospital care spanning beyond 2 midnights from the point of admission due to high intensity of service, high risk for further deterioration and high frequency of surveillance required.*       Date of Service 04/06/2024    Fidencio Hue [facility]   If 7PM-7AM, please contact night-coverage [email or web address] 04/06/2024, 1:52 AM

## 2024-04-06 ENCOUNTER — Inpatient Hospital Stay

## 2024-04-06 DIAGNOSIS — K56609 Unspecified intestinal obstruction, unspecified as to partial versus complete obstruction: Secondary | ICD-10-CM | POA: Diagnosis not present

## 2024-04-06 DIAGNOSIS — D72829 Elevated white blood cell count, unspecified: Secondary | ICD-10-CM | POA: Diagnosis present

## 2024-04-06 LAB — TYPE AND SCREEN
ABO/RH(D): O NEG
Antibody Screen: NEGATIVE

## 2024-04-06 LAB — PROTIME-INR
INR: 1.1 (ref 0.8–1.2)
Prothrombin Time: 14.7 s (ref 11.4–15.2)

## 2024-04-06 LAB — CBC
HCT: 47.8 % (ref 39.0–52.0)
Hemoglobin: 16.8 g/dL (ref 13.0–17.0)
MCH: 33.1 pg (ref 26.0–34.0)
MCHC: 35.1 g/dL (ref 30.0–36.0)
MCV: 94.1 fL (ref 80.0–100.0)
Platelets: 343 10*3/uL (ref 150–400)
RBC: 5.08 MIL/uL (ref 4.22–5.81)
RDW: 13.3 % (ref 11.5–15.5)
WBC: 15.8 10*3/uL — ABNORMAL HIGH (ref 4.0–10.5)
nRBC: 0 % (ref 0.0–0.2)

## 2024-04-06 LAB — URINALYSIS, ROUTINE W REFLEX MICROSCOPIC
Bilirubin Urine: NEGATIVE
Glucose, UA: NEGATIVE mg/dL
Hgb urine dipstick: NEGATIVE
Ketones, ur: NEGATIVE mg/dL
Leukocytes,Ua: NEGATIVE
Nitrite: NEGATIVE
Protein, ur: NEGATIVE mg/dL
Specific Gravity, Urine: >1.046 — ABNORMAL HIGH (ref 1.005–1.030)
pH: 6 (ref 5.0–8.0)

## 2024-04-06 LAB — BASIC METABOLIC PANEL WITH GFR
Anion gap: 11 (ref 5–15)
BUN: 32 mg/dL — ABNORMAL HIGH (ref 8–23)
CO2: 26 mmol/L (ref 22–32)
Calcium: 8.9 mg/dL (ref 8.9–10.3)
Chloride: 99 mmol/L (ref 98–111)
Creatinine, Ser: 0.98 mg/dL (ref 0.61–1.24)
GFR, Estimated: >60 mL/min (ref >60)
Glucose, Bld: 125 mg/dL — ABNORMAL HIGH (ref 70–99)
Potassium: 3.9 mmol/L (ref 3.5–5.1)
Sodium: 136 mmol/L (ref 135–145)

## 2024-04-06 LAB — APTT: aPTT: 29 s (ref 24–36)

## 2024-04-06 MED ORDER — LOSARTAN POTASSIUM 50 MG PO TABS
100.0000 mg | ORAL_TABLET | Freq: Every day | ORAL | Status: DC
  Administered 2024-04-07 – 2024-04-10 (×3): 100 mg via ORAL
  Filled 2024-04-06 (×4): qty 2

## 2024-04-06 MED ORDER — DILTIAZEM HCL ER COATED BEADS 120 MG PO CP24
120.0000 mg | ORAL_CAPSULE | Freq: Every day | ORAL | Status: DC
  Administered 2024-04-06 – 2024-04-10 (×4): 120 mg via ORAL
  Filled 2024-04-06 (×5): qty 1

## 2024-04-06 MED ORDER — OMEPRAZOLE 2 MG/ML ORAL SUSPENSION
20.0000 mg | Freq: Every day | ORAL | Status: DC
  Administered 2024-04-06 – 2024-04-10 (×5): 20 mg via ORAL
  Filled 2024-04-06 (×12): qty 10

## 2024-04-06 MED ORDER — METOPROLOL TARTRATE 5 MG/5ML IV SOLN: 2.5000 mg | INTRAVENOUS | Status: DC | PRN

## 2024-04-06 MED ORDER — LOSARTAN POTASSIUM 50 MG PO TABS
100.0000 mg | ORAL_TABLET | Freq: Every day | ORAL | Status: DC
  Filled 2024-04-06: qty 2

## 2024-04-06 MED ORDER — LOSARTAN POTASSIUM-HCTZ 100-12.5 MG PO TABS: 1.0000 | ORAL_TABLET | Freq: Every day | ORAL | Status: DC

## 2024-04-06 MED ORDER — HYDROCHLOROTHIAZIDE 12.5 MG PO TABS
6.2500 mg | ORAL_TABLET | Freq: Every day | ORAL | Status: DC
  Administered 2024-04-06 – 2024-04-10 (×4): 6.25 mg via ORAL
  Filled 2024-04-06 (×5): qty 1

## 2024-04-06 MED ORDER — HYDROCHLOROTHIAZIDE 12.5 MG PO TABS
12.5000 mg | ORAL_TABLET | Freq: Every day | ORAL | Status: DC
  Filled 2024-04-06: qty 1

## 2024-04-06 NOTE — Consult Note (Signed)
 [facility] SURGICAL CONSULTATION NOTE (initial) - cpt: 99254   HISTORY OF PRESENT ILLNESS (HPI):  78 y.o. male presented to [facility] ED overnight for evaluation of constipation. Patient reports that starting on Friday he began to notice vague left sided abdominal discomfort. This seemed to progress over the course of the weekend and was at it's peak on Sunday. He reports associated distension, nausea, feculent appearing emesis, and lack of bowel function over this time as well. No fever, chills, CP, SOB, urinary changes. He was unable to tolerate PO. No reported history of similar, nor bowel obstruction, previously. Previous surgical history includes trauma exploratory laparotomy and splenectomy (1991) and subsequent incisional hernia repair (2014 - Dr Heniford). Additionally, with history of atrial fibrillation on Eliquis. Work up in the ED revealed a leukocytosis to 19.2K (now 15.8K), Hgb to 19.1 likely hemoconcentration, sCr - 1.20, no electrolyte derangements. CT Abdomen/Pelvis was concerning for small bowel obstruction with transition in left abdomen. He had NGT placed and was admitted to the medicine service  Surgery is consulted by hospitalist physician Dr. Fidencio Hue, MD in this context for evaluation and management of SBO.   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    Pneumonia    hx of pneumonia   Pre-diabetes    Sleep apnea    Urinary calculi      PAST SURGICAL HISTORY (PSH):  Past Surgical History:  Procedure Laterality Date   ANTERIOR CERVICAL DECOMP/DISCECTOMY FUSION N/A 09/17/2013   Procedure: ANTERIOR CERVICAL DECOMPRESSION/DISCECTOMY FUSION 2 LEVELS;  Surgeon: Baruch Bosch,  MD;  Location: [facility];  Service: Neurosurgery;  Laterality: N/A;   COLONOSCOPY  07/2011   normal - 10 yr follow up   COLONOSCOPY N/A 05/15/2022   Procedure: COLONOSCOPY;  Surgeon: Toledo, Alphonsus Jeans, MD;  Location: [facility] ENDOSCOPY;  Service: Gastroenterology;  Laterality: N/A;   ELBOW SURGERY Right 07/25/2011   tendon repair   ESOPHAGOGASTRODUODENOSCOPY N/A 05/15/2022   Procedure: ESOPHAGOGASTRODUODENOSCOPY (EGD);  Surgeon: Toledo, Alphonsus Jeans, MD;  Location: [facility] ENDOSCOPY;  Service: Gastroenterology;  Laterality: N/A;   HERNIA REPAIR  11/23/2012   kidney stones     surgery, stone to large to pass   KNEE ARTHROSCOPY W/ ACL RECONSTRUCTION Right 2000's   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 Authorizing Provider  acetaminophen  (TYLENOL ) 325 MG tablet Take 650 mg by mouth every 6 (six) hours as needed.   Yes [provider]  apixaban (ELIQUIS) 5 MG TABS tablet Take 5 mg by mouth 2 (two) times daily.   Yes [provider]  diltiazem  (CARDIZEM  CD) 120 MG 24 hr capsule Take 120 mg by mouth daily.   Yes [provider]  esomeprazole (NEXIUM) 10 MG packet Take 10 mg by mouth daily before breakfast.   Yes [provider]  levofloxacin  (LEVAQUIN ) 500 MG tablet Take 500 mg by mouth daily. 03/31/24 04/07/24 Yes [provider]  lisinopril (PRINIVIL,ZESTRIL) 10 MG  tablet Take 10 mg by mouth daily. Dr Cara Chancellor   Yes [provider]  losartan-hydrochlorothiazide (HYZAAR) 100-12.5 MG tablet Take 1 tablet by mouth daily.   Yes [provider]  predniSONE  (STERAPRED UNI-PAK 21 TAB) 10 MG (21) TBPK tablet Take 10 mg by mouth See admin instructions. 07/31/22  Yes [provider]  cetirizine (ZYRTEC) 10 MG tablet Take 10 mg by mouth as needed.  Patient not taking: Reported on  04/05/2024    [provider]  chlorpheniramine-HYDROcodone  (TUSSIONEX PENNKINETIC ER) 10-8 MG/5ML SUER Take 5 mLs by mouth 2 (two) times daily. Patient not taking: Reported on 04/05/2024 11/06/19   Shayne Demark, MD  diazepam  (VALIUM ) 5 MG tablet Take 1 tablet (5 mg total) by mouth every 8 (eight) hours as needed for muscle spasms. Patient not taking: Reported on 04/05/2024 10/10/17   Jacquie Maudlin, MD  fexofenadine (ALLEGRA) 180 MG tablet Take 180 mg by mouth daily.    [provider]  fluticasone  (FLONASE ) 50 MCG/ACT nasal spray Place 2 sprays into both nostrils daily. Patient not taking: Reported on 04/05/2024 10/10/14   Thersia Flax, MD  ivermectin  (STROMECTOL ) 3 MG TABS tablet Take 5 tablets (15,000 mcg total) by mouth 2 (two) times a week. Take 5 tablets by mouth 3 days from now, followed by 5 tablets 6 days from now if not recovered Patient not taking: Reported on 04/05/2024 11/08/19   Shayne Demark, MD  meloxicam  (MOBIC ) 15 MG tablet Take 1 tablet (15 mg total) by mouth daily. Patient not taking: Reported on 04/05/2024 02/12/17   Sheron Dixons, MD  predniSONE  (DELTASONE ) 50 MG tablet Take 1 tablet by mouth daily Patient not taking: Reported on 04/06/2024 11/06/19   Shayne Demark, MD  terbinafine (LAMISIL) 250 MG tablet Take 250 mg by mouth daily. Patient not taking: Reported on 04/06/2024    [provider]  zaleplon (SONATA) 5 MG capsule Take 5 mg by mouth at bedtime as needed for sleep. Patient not taking: Reported on 04/05/2024    [provider]     ALLERGIES:  No Known Allergies   SOCIAL HISTORY:  Social History   Socioeconomic History   Marital status: Married    Spouse name: Not on file   Number of children: Not on file   Years of education: Not on file   Highest education level: Not on file  Occupational History   Not on file  Tobacco Use   Smoking status: Former    Current packs/day: 0.00    Average packs/day: 1 pack/day  for 5.0 years (5.0 ttl pk-yrs)    Types: Cigarettes    Start date: 10/16/1960    Quit date: 10/16/1965    Years since quitting: 58.5   Smokeless tobacco: Never   Tobacco comments:    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  Substance and Sexual Activity   Alcohol use: No    Comment: former drinker   Drug use: No   Sexual activity: Never  Other Topics Concern   Not on file  Social History Narrative   Not on file   Social Drivers of Health   Financial Resource Strain: Low Risk  (12/16/2023)   Received from [facility]   Overall Financial Resource Strain (CARDIA)    Difficulty of Paying Living Expenses: Not very hard  Food Insecurity: No Food Insecurity (04/06/2024)   Hunger Vital Sign    Worried About Running Out of Food in the Last  Year: Never true    Ran Out of Food in the Last Year: Never true  Transportation Needs: No Transportation Needs (04/06/2024)   PRAPARE - Administrator, Civil Service (Medical): No    Lack of Transportation (Non-Medical): No  Physical Activity: Not on file  Stress: Not on file  Social Connections: Socially Integrated (04/06/2024)   Social Connection and Isolation Panel [NHANES]    Frequency of Communication with Friends and Family: More than three times a week    Frequency of Social Gatherings with Friends and Family: More than three times a week    Attends Religious Services: More than 4 times per year    Active Member of [employer] or Organizations: Yes    Attends Engineer, structural: More than 4 times per year    Marital Status: Married  Catering manager Violence: Not At Risk (04/06/2024)   Humiliation, Afraid, Rape, and Kick questionnaire    Fear of Current or Ex-Partner: No    Emotionally Abused: No    Physically Abused: No    Sexually Abused: No     FAMILY HISTORY:  Family History  Problem Relation Age of Onset   Cancer Maternal Grandmother    Cancer Paternal Grandmother       REVIEW OF SYSTEMS:   Review of Systems  Constitutional:  Negative for chills and fever.  Respiratory:  Negative for cough and shortness of breath.   Cardiovascular:  Negative for chest pain and palpitations.  Gastrointestinal:  Positive for abdominal pain, nausea and vomiting. Negative for constipation and diarrhea.  Genitourinary:  Negative for dysuria and urgency.  All other systems reviewed and are negative.   VITAL SIGNS:  Temp:  [97.1 F (36.2 C)-98.4 F (36.9 C)] 97.5 F (36.4 C) (06/03 0855) Pulse Rate:  [74-99] 76 (06/03 0855) Resp:  [17-20] 17 (06/03 0855) BP: (135-161)/(83-114) 135/83 (06/03 0855) SpO2:  [94 %-95 %] 95 % (06/03 0855) Weight:  [71.7 kg] 71.7 kg (06/02 2043)     Height: 5\' 8"  (172.7 cm) Weight: 71.7 kg BMI (Calculated): 24.03   INTAKE/OUTPUT:  06/02 0701 - 06/03 0700 In: 444.4 [I.V.:444.4] Out: 300 [Urine:300]  PHYSICAL EXAM:  Physical Exam Vitals and nursing note reviewed. Exam conducted with a chaperone present.  Constitutional:      General: He is not in acute distress.    Appearance: Normal appearance. He is normal weight. He is not ill-appearing.     Comments: Sitting in bed; NAD. Family present.   HENT:     Head: Normocephalic and atraumatic.     Nose:     Comments: NGT in place; output feculent  Eyes:     General: No scleral icterus.    Conjunctiva/sclera: Conjunctivae normal.  Cardiovascular:     Rate and Rhythm: Normal rate.     Heart sounds: No murmur heard. Pulmonary:     Effort: Pulmonary effort is normal. No respiratory distress.  Abdominal:     General: A surgical scar is present. There is distension.     Palpations: Abdomen is soft.     Tenderness: There is no abdominal tenderness. There is no guarding or rebound.     Comments: Abdomen is soft, he is certainly distended, he does not appear overtly tender, no rebound/guarding. Large midline laparotomy scar present   Genitourinary:    Comments: Deferred Musculoskeletal:     Right lower leg: No  edema.     Left lower leg: No edema.  Skin:    General:  Skin is warm and dry.     Findings: No erythema.  Neurological:     General: No focal deficit present.     Mental Status: He is alert and oriented to person, place, and time.  Psychiatric:        Mood and Affect: Mood normal.        Behavior: Behavior normal.      Labs:     Latest Ref Rng & Units 04/06/2024    4:11 AM 04/05/2024    8:50 PM 11/06/2019    9:46 AM  CBC  WBC 4.0 - 10.5 K/uL 15.8  19.2  10.8   Hemoglobin 13.0 - 17.0 g/dL 81.1  91.4  78.2   Hematocrit 39.0 - 52.0 % 47.8  53.0  48.4   Platelets 150 - 400 K/uL 343  358  333       Latest Ref Rng & Units 04/06/2024    4:11 AM 04/05/2024    8:50 PM 11/06/2019    9:46 AM  CMP  Glucose 70 - 99 mg/dL 956  213  086   BUN 8 - 23 mg/dL 32  36  16   Creatinine 0.61 - 1.24 mg/dL 5.78  4.69  6.29   Sodium 135 - 145 mmol/L 136  137  134   Potassium 3.5 - 5.1 mmol/L 3.9  4.0  4.3   Chloride 98 - 111 mmol/L 99  98  101   CO2 22 - 32 mmol/L 26  24  20    Calcium 8.9 - 10.3 mg/dL 8.9  9.8  8.9   Total Protein 6.5 - 8.1 g/dL  8.1    Total Bilirubin 0.0 - 1.2 mg/dL  1.4    Alkaline Phos 38 - 126 U/L  59    AST 15 - 41 U/L  25    ALT 0 - 44 U/L  28       Imaging studies:   CT Abdomen/Pelvis (04/05/2024) personally reviewed with dilated stomach and proximal small bowel, transition in left abdomen, no free air, no pneumatosis, and radiologist report reviewed below:  IMPRESSION: 1. Small-bowel obstruction with transition point in the left mid abdomen. Mild mesenteric edema in the region of small bowel dilatation. No pneumatosis or free air. 2. Hepatic steatosis. 3. Cholelithiasis without cholecystitis. 4. Nonobstructing left renal stone. 5. Colonic diverticulosis without diverticulitis.   KUB (04/06/2024) personally reviewed with NGT as noted, air seen in colon, and radiologist report reviewed below:  IMPRESSION: 1. Enteric tube terminates in the proximal gastric body with  its side port at/above the GE junction. Advancement 5 cm is suggested.   Assessment/Plan: (ICD-10's: K27.609) 78 y.o. 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   - Appreciate medicine admission  - Agree with NGT decompression; tube advanced to 70 cm at right nare this afternoon. Continue low-intermittent wall suction. This is quite small and I do worry this will clog. He does have history of sinus surgery previously.   - For now, we will attempt to manage this conservatively. He is certainly at increased risk for operative intervention given his previous surgeries, need for anticoagulation. He, and his family, understand that if he fails to improve in the next 48 hours (or worsens in any fashion), we will need to consider this.    - Morning KUB; may do gastrografin in next 24 hours is no improvement - Monitor abdominal examination; on-going bowel function  -  Pain control prn; antiemetics prn - Monitor leukocytosis   - Further management per primary service; we will follow along   All of the above findings and recommendations were discussed with the patient and his family, and all of patient's and his family's questions were answered to their expressed satisfaction.  Thank you for the opportunity to participate in this patient's care.   Face-to-face time spent with the patient and care providers was 60 minutes, with more than 50% of the time spent counseling, educating, and coordinating care of the patient.     -- Apolonio Bay, PA-C [facility] 04/06/2024, 10:47 AM M-F: 7am - 4pm

## 2024-04-06 NOTE — Progress Notes (Incomplete)
 Responded to Paged our RRT

## 2024-04-06 NOTE — TOC CM/SW Note (Signed)
 Transition of Care [facility]) - Inpatient Brief Assessment   Patient Details  Name: DEFOREST MAIDEN MRN: [id number] Date of Birth: 1946/09/12  Transition of Care [facility]) CM/SW Contact:    Loman Risk, RN Phone Number: 04/06/2024, 12:36 PM   Clinical Narrative:   Transition of Care [facility]) Screening Note   Patient Details  Name: REMMINGTON URIETA Date of Birth: May 08, 1946   Transition of Care [facility]) CM/SW Contact:    Loman Risk, RN Phone Number: 04/06/2024, 12:36 PM    Transition of Care Department [facility]) has reviewed patient and no TOC needs have been identified at this time.  If new patient transition needs arise, please place a TOC consult.    Transition of Care Asessment: Insurance and Status: Insurance coverage has been reviewed Patient has primary care physician: Yes     Prior/Current Home Services: No current home services Social Drivers of Health Review: SDOH reviewed no interventions necessary Readmission risk has been reviewed: Yes Transition of care needs: no transition of care needs at this time

## 2024-04-06 NOTE — Progress Notes (Signed)
 James Logan PROGRESS NOTE    James Logan  WUJ:[id number] DOB: Sep 11, 1946 DOA: 04/05/2024 PCP: [name], MD    Brief Narrative:   78 y.o. male with medical history significant of hernia repair, HTN, HLD, prediabetes, childhood asthma, depression, BPH, GERD, A-fib on Eliquis, kidney stone, hiatal hernia, who presents with constipation, abdominal distention, abdominal pain, nausea, vomiting.   Patient states he has been constipated in the past 3 days.  Last bowel movement was on Friday.  He has abdominal distention, diffuse abdominal pain, nausea, vomiting, no diarrhea.  His abdominal pain is constant, aching, mild to moderate, nonradiating, not aggravated or alleviated by any known factors.  No fever or chills.  No symptoms of UTI.  No chest pain, cough, SOB.   Of note, patient was recently treated for sinus infection with amoxicillin  with improvement, then symptoms came back.  PCP started him on Levaquin  and prednisone  tapering on 5/28.  His symptoms have significantly improved.  He stopped taking these medication since he has abdominal distention and pain.  He does not want to restart prednisone  and Levaquin  today.  Patient states that he took Eliquis yesterday morning.  NG tube placed in ED.  Seen by general surgery on 6/3.  Patient remains high risk for surgical intervention but will attempt to manage conservatively for now.   Assessment & Plan:   Principal Problem:   SBO (small bowel obstruction) (HCC) Active Problems:   Chronic atrial fibrillation   HTN (hypertension)   Prediabetes   Recurrent sinusitis   Leukocytosis     SBO (small bowel obstruction) (HCC):  CT showed SBO with transition point in the left mid abdomen, and mild mesenteric edema in the region of small bowel dilatation. Consulted Dr. Cornel Diesel of surgery. Plan: Continue n.p.o. Continue NGT set to LIS As needed pain and nausea control IV fluids Continue holding Eliquis Appreciate surgical follow-up Will  attempt conservative management for now but patient may need surgical exploration if clinical improvement not observed over the next 24 to 48 hours.  Chronic atrial fibrillation:  Heart rate 90s -IV as needed metoprolol 2.5 mg every 2 hour for heart rate> 125 - Continue home cardia - Continue to hold Eliquis in case patient needed surgery   HTN (hypertension) -IV hydralazine as needed - Continue Cardia   Prediabetes: Recent A1c 5.7, well-controlled.  Patient is not taking medications. - Every morning CBG   Recurrent sinusitis: Symptoms have almost resolved.  Patient does not want to continue steroid and Levaquin . - Continue as needed Mucinex  and albuterol   Leukocytosis: WBC 19.2, no fever, likely due to recent steroid use. - No indication for antibiotics   DVT prophylaxis: SCD Code Status: Full Family Communication: Spouse and other family members at bedside 6/3 Disposition Plan: Status is: Inpatient Remains inpatient appropriate because: SBO   Level of care: Telemetry Medical  Consultants:  General surgery  Procedures:  NG tube placement  Antimicrobials: None   Subjective: Seen and examined resting in bed.  Answers all questions appropriately.  Reports symptomatic improvement since placement of NGT.  Scant amount of feculent NG output.  Objective: Vitals:   04/05/24 2139 04/06/24 0037 04/06/24 0418 04/06/24 0855  BP: (!) 153/114 (!) 152/99 (!) 144/87 135/83  Pulse: 99 74 74 76  Resp: 20 20 20 17   Temp: 97.8 F (36.6 C) 98.4 F (36.9 C) 98.2 F (36.8 C) (!) 97.5 F (36.4 C)  TempSrc: Oral Oral Oral Oral  SpO2: 95% 94% 94% 95%  Weight:  Height:        Intake/Output Summary (Last 24 hours) at 04/06/2024 1427 Last data filed at 04/06/2024 0645 Gross per 24 hour  Intake 444.4 ml  Output 300 ml  Net 144.4 ml   Filed Weights   04/05/24 2043  Weight: 71.7 kg    Examination:  General exam: Appears calm and comfortable  Respiratory system: Bibasilar  crackles.  Normal work of breathing.  Room air Cardiovascular system: S1-S2, regular rate, irregular rhythm, no murmurs, no pedal edema Gastrointestinal system: Obese, mild distention, mild TTP left upper quadrant, diminished bowel sounds, NGT in place Central nervous system: Alert and oriented. No focal neurological deficits. Extremities: Symmetric 5 x 5 power. Skin: No rashes, lesions or ulcers Psychiatry: Judgement and insight appear normal. Mood & affect appropriate.     Data Reviewed: I have personally reviewed following labs and imaging studies  CBC: Recent Labs  Lab 04/05/24 2050 04/06/24 0411  WBC 19.2* 15.8*  HGB 19.1* 16.8  HCT 53.0* 47.8  MCV 93.8 94.1  PLT 358 343   Basic Metabolic Panel: Recent Labs  Lab 04/05/24 2050 04/06/24 0411  NA 137 136  K 4.0 3.9  CL 98 99  CO2 24 26  GLUCOSE 163* 125*  BUN 36* 32*  CREATININE 1.20 0.98  CALCIUM 9.8 8.9   GFR: Estimated Creatinine Clearance: 60.1 mL/min (by C-G formula based on SCr of 0.98 mg/dL). Liver Function Tests: Recent Labs  Lab 04/05/24 2050  AST 25  ALT 28  ALKPHOS 59  BILITOT 1.4*  PROT 8.1  ALBUMIN 4.5   Recent Labs  Lab 04/05/24 2050  LIPASE 25   No results for input(s): "AMMONIA" in the last 168 hours. Coagulation Profile: Recent Labs  Lab 04/06/24 0116  INR 1.1   Cardiac Enzymes: No results for input(s): "CKTOTAL", "CKMB", "CKMBINDEX", "TROPONINI" in the last 168 hours. BNP (last 3 results) No results for input(s): "PROBNP" in the last 8760 hours. HbA1C: No results for input(s): "HGBA1C" in the last 72 hours. CBG: No results for input(s): "GLUCAP" in the last 168 hours. Lipid Profile: No results for input(s): "CHOL", "HDL", "LDLCALC", "TRIG", "CHOLHDL", "LDLDIRECT" in the last 72 hours. Thyroid  Function Tests: No results for input(s): "TSH", "T4TOTAL", "FREET4", "T3FREE", "THYROIDAB" in the last 72 hours. Anemia Panel: No results for input(s): "VITAMINB12", "FOLATE",  "FERRITIN", "TIBC", "IRON", "RETICCTPCT" in the last 72 hours. Sepsis Labs: No results for input(s): "PROCALCITON", "LATICACIDVEN" in the last 168 hours.  No results found for this or any previous visit (from the past 240 hours).       Radiology Studies: DG Abd Portable 1 View Result Date: 04/06/2024 EXAM: 1 VIEW XRAY OF THE ABDOMEN SUPINE 04/06/2024 12:13:07 AM COMPARISON: None available. CLINICAL HISTORY: NG tube placement. FINDINGS: BOWEL: Nonobstructive bowel gas pattern. PERITONEUM AND SOFT TISSUES: No abnormal calcifications. BONES: No acute osseous abnormality. LINES AND TUBES: Enteric tube terminates in the proximal gastric body with its side port at/above the GE junction. Advancement 5 cm is suggested. IMPRESSION: 1. Enteric tube terminates in the proximal gastric body with its side port at/above the GE junction. Advancement 5 cm is suggested. Electronically signed by: Zadie Herter MD 04/06/2024 12:16 AM EDT RP Workstation: [id number]   CT ABDOMEN PELVIS W CONTRAST Result Date: 04/05/2024 CLINICAL DATA:  Vomiting. Bloating, constipation, pain. Evaluate for bowel obstruction. EXAM: CT ABDOMEN AND PELVIS WITH CONTRAST TECHNIQUE: Multidetector CT imaging of the abdomen and pelvis was performed using the standard protocol following bolus administration of intravenous contrast. RADIATION DOSE  REDUCTION: This exam was performed according to the departmental dose-optimization program which includes automated exposure control, adjustment of the mA and/or kV according to patient size and/or use of iterative reconstruction technique. CONTRAST:  100mL OMNIPAQUE IOHEXOL 300 MG/ML  SOLN COMPARISON:  CT 10/10/2017 FINDINGS: Lower chest: The heart is mildly enlarged. No basilar airspace disease or pleural effusion. Hepatobiliary: Mild diffuse hepatic steatosis. No focal liver lesion. Punctate gallstones, series 2, image 32. No pericholecystic inflammation or abnormal gallbladder distension. No biliary  dilatation. Pancreas: Mild fatty atrophy.  No ductal dilatation or inflammation. Spleen: Splenectomy. Adrenals/Urinary Tract: No adrenal nodule. Nonobstructing 7 mm stone in the lower left kidney. No hydronephrosis. No renal inflammation. Partially distended urinary bladder, normal for degree of distension. Stomach/Bowel: Dilated fluid-filled stomach. Dilated fluid-filled proximal small bowel measuring up to 4.3 cm diameter. Transition point from dilated to nondilated in the left mid abdomen, series 5, image 18. The small bowel distal to this is decompressed. Mild mesenteric edema in the region of small bowel dilatation. No pneumatosis. The appendix is normal. Scattered colonic diverticula without diverticulitis. Small volume of formed stool in the colon. Vascular/Lymphatic: Aortic atherosclerosis. No aortic aneurysm. Portal vein is patent. No portal venous or mesenteric gas. No lymphadenopathy. Reproductive: Prominent prostate spans 4.6 cm. Other: No free air or ascites. Minimal fat in the inguinal canals. Post abdominal wall hernia repair. Small peripherally calcified structure deep to the area of surgery is likely an area of prior fat necrosis. Musculoskeletal: The bones are subjectively under mineralized. Moderate right hip osteoarthritis. Left hip arthroplasty. No acute osseous findings. IMPRESSION: 1. Small-bowel obstruction with transition point in the left mid abdomen. Mild mesenteric edema in the region of small bowel dilatation. No pneumatosis or free air. 2. Hepatic steatosis. 3. Cholelithiasis without cholecystitis. 4. Nonobstructing left renal stone. 5. Colonic diverticulosis without diverticulitis. Aortic Atherosclerosis (ICD10-I70.0). Electronically Signed   By: Chadwick Colonel M.D.   On: 04/05/2024 22:35   DG Abdomen 1 View Result Date: 04/05/2024 CLINICAL DATA:  Constipation EXAM: ABDOMEN - 1 VIEW COMPARISON:  None Available. FINDINGS: Nonobstructive bowel gas pattern. Moderate to large volume  stool. No organomegaly. 7 mm calculus overlies the expected lower pole of the left kidney. Rounded calculus within right mid abdomen corresponds to a extraluminal calculus within mesenteric fat on prior CT examination of 10/10/2017. Degenerative changes are seen within the lumbar spine right hip. Left total hip arthroplasty has been performed. IMPRESSION: 1. Moderate to large volume stool. 2. 7 mm left nephrolithiasis. Electronically Signed   By: Worthy Heads M.D.   On: 04/05/2024 21:13        Scheduled Meds:  diltiazem   120 mg Oral Daily   [START ON 04/07/2024] losartan  100 mg Oral Daily   And   hydrochlorothiazide  6.25 mg Oral Daily   omeprazole   20 mg Oral QAC breakfast   Continuous Infusions:  sodium chloride  75 mL/hr at 04/06/24 1413     LOS: 1 day     Tiajuana Fluke, MD [facility]   If 7PM-7AM, please contact night-coverage  04/06/2024, 2:27 PM

## 2024-04-07 ENCOUNTER — Inpatient Hospital Stay

## 2024-04-07 DIAGNOSIS — K56609 Unspecified intestinal obstruction, unspecified as to partial versus complete obstruction: Secondary | ICD-10-CM | POA: Diagnosis not present

## 2024-04-07 LAB — GLUCOSE, CAPILLARY: Glucose-Capillary: 77 mg/dL (ref 70–99)

## 2024-04-07 MED ORDER — DEXTROSE-SODIUM CHLORIDE 5-0.45 % IV SOLN: INTRAVENOUS | Status: AC

## 2024-04-07 NOTE — Plan of Care (Signed)

## 2024-04-07 NOTE — Progress Notes (Signed)
 [facility] SURGICAL PROGRESS NOTE (cpt [phone])  Hospital Day(s): 2.   Interval History: Patient seen and examined, no acute events or new complaints overnight. Patient reports he feels a little better. Biggest complaint is sinus headache. No fever, chills, nausea, emesis. There are no new labs this AM. NGT with 800 ccs out in last 24 hours; this is clearing. KUB this morning looks improved, still with dilated loops in LUQ. He is passed some flatus and did have a BM.   Review of Systems:  Constitutional: denies fever, chills  HEENT: denies cough or congestion  Respiratory: denies any shortness of breath  Cardiovascular: denies chest pain or palpitations  Gastrointestinal: + distension (improving), denied nausea, emesis  Genitourinary: denies burning with urination or urinary frequency Musculoskeletal: denies pain, decreased motor or sensation  Vital signs in last 24 hours: [min-max] current  Temp:  [97.5 F (36.4 C)-97.9 F (36.6 C)] 97.7 F (36.5 C) (06/04 0413) Pulse Rate:  [74-76] 74 (06/04 0413) Resp:  [17-20] 20 (06/04 0413) BP: (123-145)/(63-86) 123/63 (06/04 0413) SpO2:  [95 %-98 %] 96 % (06/04 0413)     Height: 5\' 8"  (172.7 cm) Weight: 71.7 kg BMI (Calculated): 24.03   Intake/Output last 2 shifts:  06/03 0701 - 06/04 0700 In: 60 [NG/GT:60] Out: 800 [Emesis/NG output:800]   Physical Exam:  Constitutional: alert, cooperative and no distress  HENT: normocephalic without obvious abnormality; NGT in place; output thinning  Eyes: PERRL, EOM's grossly intact and symmetric  Respiratory: breathing non-labored at rest  Cardiovascular: regular rate and sinus rhythm  Gastrointestinal: Soft, distention is improved some this AM, he is not overtly tender, no rebound/guarding  Musculoskeletal: no edema or wounds, motor and sensation grossly intact, NT    Labs:     Latest Ref Rng & Units 04/06/2024    4:11 AM 04/05/2024    8:50 PM 11/06/2019    9:46 AM  CBC  WBC 4.0 -  10.5 K/uL 15.8  19.2  10.8   Hemoglobin 13.0 - 17.0 g/dL 60.4  54.0  98.1   Hematocrit 39.0 - 52.0 % 47.8  53.0  48.4   Platelets 150 - 400 K/uL 343  358  333       Latest Ref Rng & Units 04/06/2024    4:11 AM 04/05/2024    8:50 PM 11/06/2019    9:46 AM  CMP  Glucose 70 - 99 mg/dL 191  478  295   BUN 8 - 23 mg/dL 32  36  16   Creatinine 0.61 - 1.24 mg/dL 6.21  3.08  6.57   Sodium 135 - 145 mmol/L 136  137  134   Potassium 3.5 - 5.1 mmol/L 3.9  4.0  4.3   Chloride 98 - 111 mmol/L 99  98  101   CO2 22 - 32 mmol/L 26  24  20    Calcium 8.9 - 10.3 mg/dL 8.9  9.8  8.9   Total Protein 6.5 - 8.1 g/dL  8.1    Total Bilirubin 0.0 - 1.2 mg/dL  1.4    Alkaline Phos 38 - 126 U/L  59    AST 15 - 41 U/L  25    ALT 0 - 44 U/L  28       Imaging studies:   KUB (04/07/2024) personally reviewed with dilated loops in LUQ, overall may be improved, no free air, and radiologist report reviewed below:  IMPRESSION: 1. No acute findings. 2. Gastric tube to the decompressed stomach.  Assessment/Plan: (ICD-10's: K77.609) 78 y.o. 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               - I do think we should continue NGT decompression for another 24 hours; low-intermittent wall suction; monitor and record output daily. If he continues to improve, we can potentially do gravity trial tomorrow morning.              - For now, we will attempt to manage this conservatively. He is certainly at increased risk for operative intervention given his previous surgeries, need for anticoagulation. Fortunately, he is making improvement this morning.  - Monitor abdominal examination; on-going bowel function  - Pain control prn; antiemetics prn             - Further management per primary service; we will follow along   All of the above findings and recommendations were discussed  with the patient, patient's family at bedside, and the medical team, and all of patient's and family's questions were answered to their expressed satisfaction.   -- Apolonio Bay, PA-C [facility] 04/07/2024, 7:40 AM M-F: 7am - 4pm

## 2024-04-07 NOTE — Plan of Care (Signed)

## 2024-04-07 NOTE — Care Management Important Message (Signed)
 Important Message  Patient Details  Name: James Logan MRN: [id number] Date of Birth: 05/16/1946   Important Message Given:  Yes - Medicare IM     Anise Kerns 04/07/2024, 12:23 PM

## 2024-04-07 NOTE — Progress Notes (Signed)
 Progress Note   Patient: James Logan UJW:[id number] DOB: January 11, 1946 DOA: 04/05/2024     2 DOS: the patient was seen and examined on 04/07/2024      Brief Narrative:   78 y.o. male with medical history significant of hernia repair, HTN, HLD, prediabetes, childhood asthma, depression, BPH, GERD, A-fib on Eliquis, kidney stone, hiatal hernia, who presents with constipation, abdominal distention, abdominal pain, nausea, vomiting.   Patient states he has been constipated in the past 3 days.  Last bowel movement was on Friday.  He has abdominal distention, diffuse abdominal pain, nausea, vomiting, no diarrhea.  His abdominal pain is constant, aching, mild to moderate, nonradiating, not aggravated or alleviated by any known factors.  No fever or chills.  No symptoms of UTI.  No chest pain, cough, SOB.   Of note, patient was recently treated for sinus infection with amoxicillin  with improvement, then symptoms came back.  PCP started him on Levaquin  and prednisone  tapering on 5/28.  His symptoms have significantly improved.  He stopped taking these medication since he has abdominal distention and pain.  He does not want to restart prednisone  and Levaquin  today.  Patient states that he took Eliquis yesterday morning.   NG tube placed in ED.  Seen by general surgery on 6/3.  Patient remains high risk for surgical intervention but will attempt to manage conservatively for now.     Assessment & Plan:   Principal Problem:   SBO (small bowel obstruction) (HCC) Active Problems:   Chronic atrial fibrillation   HTN (hypertension)   Prediabetes   Recurrent sinusitis   Leukocytosis      SBO (small bowel obstruction) (HCC):  CT showed SBO with transition point in the left mid abdomen, and mild mesenteric edema in the region of small bowel dilatation. Consulted Dr. Cornel Diesel of surgery. Plan: Continue n.p.o. as well as NG tube management Plan of care discussed with general surgery team As needed pain and  nausea control Continue supplemental IV fluid Continue holding Eliquis Appreciate surgical follow-up   Chronic atrial fibrillation:  Heart rate 90s -IV as needed metoprolol 2.5 mg every 2 hour for heart rate> 125 - Continue home cardia - Continue to hold Eliquis in case patient needed surgery   HTN (hypertension) -IV hydralazine as needed Continue Cardia   Prediabetes: Recent A1c 5.7, well-controlled.  Patient is not taking medications. - Every morning CBG   Recurrent sinusitis: Symptoms have almost resolved.  Patient does not want to continue steroid and Levaquin . - Continue as needed Mucinex  and albuterol   Leukocytosis: WBC 19.2, no fever, likely due to recent steroid use. - No indication for antibiotics     DVT prophylaxis: SCD Code Status: Full Family Communication: Spouse and other family members at bedside 6/3 Disposition Plan: Status is: Inpatient Remains inpatient appropriate because: SBO   Level of care: Telemetry Medical   Consultants:  General surgery   Procedures:  NG tube placement   Antimicrobials: None     Subjective: Patient seen and examined in the presence of the wife He is passing gas and able to walk around some He denied worsening abdominal pain chest pain or cough Still has NG tube in place   Examination:   General exam: Appears calm and comfortable  Respiratory system: Bibasilar crackles.  Normal work of breathing.  Room air Cardiovascular system: S1-S2, regular rate, irregular rhythm, no murmurs, no pedal edema Gastrointestinal system: Obese, mild distention, mild TTP left upper quadrant, diminished bowel sounds, NGT in place  Central nervous system: Alert and oriented. No focal neurological deficits. Extremities: Symmetric 5 x 5 power. Skin: No rashes, lesions or ulcers Psychiatry: Judgement and insight appear normal. Mood & affect appropriate.        Data Reviewed:     Latest Ref Rng & Units 04/06/2024    4:11 AM 04/05/2024     8:50 PM 11/06/2019    9:46 AM  CBC  WBC 4.0 - 10.5 K/uL 15.8  19.2  10.8   Hemoglobin 13.0 - 17.0 g/dL 86.5  78.4  69.6   Hematocrit 39.0 - 52.0 % 47.8  53.0  48.4   Platelets 150 - 400 K/uL 343  358  333        Latest Ref Rng & Units 04/06/2024    4:11 AM 04/05/2024    8:50 PM 11/06/2019    9:46 AM  BMP  Glucose 70 - 99 mg/dL 295  284  132   BUN 8 - 23 mg/dL 32  36  16   Creatinine 0.61 - 1.24 mg/dL 4.40  1.02  7.25   Sodium 135 - 145 mmol/L 136  137  134   Potassium 3.5 - 5.1 mmol/L 3.9  4.0  4.3   Chloride 98 - 111 mmol/L 99  98  101   CO2 22 - 32 mmol/L 26  24  20    Calcium 8.9 - 10.3 mg/dL 8.9  9.8  8.9     Vitals:   04/06/24 1705 04/06/24 2051 04/07/24 0413 04/07/24 0754  BP: (!) 145/86 125/70 123/63 130/71  Pulse:  74 74 69  Resp: 18 20 20 15   Temp: 97.8 F (36.6 C) 97.9 F (36.6 C) 97.7 F (36.5 C) 98.2 F (36.8 C)  TempSrc: Oral Oral Oral Oral  SpO2: 98% 96% 96% 93%  Weight:      Height:      I have reviewed the patient's abdominal CT scan as well as abdominal x-ray today  Family Communication: Discussed with patient's wife at bedside   Time spent: 45 minutes  Author: Ezzard Holms, MD 04/07/2024 3:26 PM  For on call review [email or web address].

## 2024-04-08 DIAGNOSIS — K56609 Unspecified intestinal obstruction, unspecified as to partial versus complete obstruction: Secondary | ICD-10-CM | POA: Diagnosis not present

## 2024-04-08 LAB — CBC WITH DIFFERENTIAL/PLATELET
Abs Immature Granulocytes: 0.16 10*3/uL — ABNORMAL HIGH (ref 0.00–0.07)
Basophils Absolute: 0.1 10*3/uL (ref 0.0–0.1)
Basophils Relative: 1 %
Eosinophils Absolute: 0.2 10*3/uL (ref 0.0–0.5)
Eosinophils Relative: 1 %
HCT: 41.3 % (ref 39.0–52.0)
Hemoglobin: 14.8 g/dL (ref 13.0–17.0)
Immature Granulocytes: 1 %
Lymphocytes Relative: 33 %
Lymphs Abs: 4.1 10*3/uL — ABNORMAL HIGH (ref 0.7–4.0)
MCH: 34.3 pg — ABNORMAL HIGH (ref 26.0–34.0)
MCHC: 35.8 g/dL (ref 30.0–36.0)
MCV: 95.8 fL (ref 80.0–100.0)
Monocytes Absolute: 1.4 10*3/uL — ABNORMAL HIGH (ref 0.1–1.0)
Monocytes Relative: 11 %
Neutro Abs: 6.5 10*3/uL (ref 1.7–7.7)
Neutrophils Relative %: 53 %
Platelets: 274 10*3/uL (ref 150–400)
RBC: 4.31 MIL/uL (ref 4.22–5.81)
RDW: 13 % (ref 11.5–15.5)
WBC: 12.3 10*3/uL — ABNORMAL HIGH (ref 4.0–10.5)
nRBC: 0 % (ref 0.0–0.2)

## 2024-04-08 LAB — BASIC METABOLIC PANEL WITH GFR
Anion gap: 8 (ref 5–15)
BUN: 21 mg/dL (ref 8–23)
CO2: 27 mmol/L (ref 22–32)
Calcium: 7.9 mg/dL — ABNORMAL LOW (ref 8.9–10.3)
Chloride: 103 mmol/L (ref 98–111)
Creatinine, Ser: 0.95 mg/dL (ref 0.61–1.24)
GFR, Estimated: >60 mL/min (ref >60)
Glucose, Bld: 95 mg/dL (ref 70–99)
Potassium: 3.4 mmol/L — ABNORMAL LOW (ref 3.5–5.1)
Sodium: 138 mmol/L (ref 135–145)

## 2024-04-08 LAB — GLUCOSE, CAPILLARY: Glucose-Capillary: 111 mg/dL — ABNORMAL HIGH (ref 70–99)

## 2024-04-08 MED ORDER — BOOST / RESOURCE BREEZE PO LIQD CUSTOM
1.0000 | Freq: Three times a day (TID) | ORAL | Status: DC
  Administered 2024-04-08 – 2024-04-10 (×4): 1 via ORAL

## 2024-04-08 MED ORDER — POTASSIUM CHLORIDE 10 MEQ/100ML IV SOLN
10.0000 meq | INTRAVENOUS | Status: AC
  Administered 2024-04-08 (×5): 10 meq via INTRAVENOUS
  Filled 2024-04-08 (×5): qty 100

## 2024-04-08 NOTE — Progress Notes (Signed)
 No output from NG after being clamped for 4 hours. NG being removed and clear liquid diet per Dr Cornel Diesel request

## 2024-04-08 NOTE — Plan of Care (Signed)

## 2024-04-08 NOTE — Progress Notes (Addendum)
 [facility] SURGICAL PROGRESS NOTE (cpt [phone])  Hospital Day(s): 3.   Interval History: Patient seen and examined, no acute events or new complaints overnight. Patient reports he is doing better. No abdominal pain. He reports his abdomen is much softer. No fever, chills, nausea, emesis. Previous leukocytosis seen continues to improve; WBC 12.3K. Hgb to 14.8. Renal function normal; sCr - 0.95; UO - 300 ccs. Mild hypokalemia to 3.4. NGT with 300 ccs out in last 24 hours; thinner. He has passed flatus in last 24 hours.   Review of Systems:  Constitutional: denies fever, chills  HEENT: denies cough or congestion  Respiratory: denies any shortness of breath  Cardiovascular: denies chest pain or palpitations  Gastrointestinal: + distension (improving), denied nausea, emesis  Genitourinary: denies burning with urination or urinary frequency Musculoskeletal: denies pain, decreased motor or sensation  Vital signs in last 24 hours: [min-max] current  Temp:  [98 F (36.7 C)-98.5 F (36.9 C)] 98.4 F (36.9 C) (06/05 0347) Pulse Rate:  [69-71] 69 (06/05 0347) Resp:  [15-20] 20 (06/05 0347) BP: (101-130)/(53-71) 103/67 (06/05 0347) SpO2:  [93 %-95 %] 95 % (06/05 0347)     Height: 5\' 8"  (172.7 cm) Weight: 71.7 kg BMI (Calculated): 24.03   Intake/Output last 2 shifts:  06/04 0701 - 06/05 0700 In: 934.4 [I.V.:934.4] Out: 300 [Emesis/NG output:300]   Physical Exam:  Constitutional: alert, cooperative and no distress  HENT: normocephalic without obvious abnormality; NGT in place; output thinning  Eyes: PERRL, EOM's grossly intact and symmetric  Respiratory: breathing non-labored at rest  Cardiovascular: regular rate and sinus rhythm  Gastrointestinal: Soft, distention is improved some this AM, he is not overtly tender, no rebound/guarding  Musculoskeletal: no edema or wounds, motor and sensation grossly intact, NT    Labs:     Latest Ref Rng & Units 04/08/2024    4:42 AM  04/06/2024    4:11 AM 04/05/2024    8:50 PM  CBC  WBC 4.0 - 10.5 K/uL 12.3  15.8  19.2   Hemoglobin 13.0 - 17.0 g/dL 29.5  62.1  30.8   Hematocrit 39.0 - 52.0 % 41.3  47.8  53.0   Platelets 150 - 400 K/uL 274  343  358       Latest Ref Rng & Units 04/08/2024    4:42 AM 04/06/2024    4:11 AM 04/05/2024    8:50 PM  CMP  Glucose 70 - 99 mg/dL 95  657  846   BUN 8 - 23 mg/dL 21  32  36   Creatinine 0.61 - 1.24 mg/dL 9.62  9.52  8.41   Sodium 135 - 145 mmol/L 138  136  137   Potassium 3.5 - 5.1 mmol/L 3.4  3.9  4.0   Chloride 98 - 111 mmol/L 103  99  98   CO2 22 - 32 mmol/L 27  26  24    Calcium 8.9 - 10.3 mg/dL 7.9  8.9  9.8   Total Protein 6.5 - 8.1 g/dL   8.1   Total Bilirubin 0.0 - 1.2 mg/dL   1.4   Alkaline Phos 38 - 126 U/L   59   AST 15 - 41 U/L   25   ALT 0 - 44 U/L   28      Imaging studies:  No new imaging studies    Assessment/Plan: (ICD-10's: K7.609) 78 y.o. 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               - We will proceed with 4 hour gravity trial this morning. If residuals after 4 hours are <200 ccs, we can remove this and start CLD.               - For now, we will attempt to manage this conservatively. He is certainly at increased risk for operative intervention given his previous surgeries, need for anticoagulation. I spent significant time again this morning with patient and his family regarding the rationale for this and his progression this far. They understand with his age, comorbidities, and previous procedures the safest option for him to to avoid surgical intervention. They did ask about elective LOA as well; however, this is is first episode of SBO and I do think the risk of this certainly outweigh the benefits currently. They are understanding and appreciative.  - Monitor abdominal examination; on-going bowel  function  - Pain control prn; antiemetics prn             - Further management per primary service; we will follow along   All of the above findings and recommendations were discussed with the patient, patient's family at bedside, and the medical team, and all of patient's and family's questions were answered to their expressed satisfaction.  -- Apolonio Bay, PA-C [facility] 04/08/2024, 7:39 AM M-F: 7am - 4pm

## 2024-04-08 NOTE — Progress Notes (Signed)
 Progress Note   Patient: James Logan ZOX:[id number] DOB: 06-13-1946 DOA: 04/05/2024     3 DOS: the patient was seen and examined on 04/08/2024     Brief Narrative:   78 y.o. male with medical history significant of hernia repair, HTN, HLD, prediabetes, childhood asthma, depression, BPH, GERD, A-fib on Eliquis, kidney stone, hiatal hernia, who presents with constipation, abdominal distention, abdominal pain, nausea, vomiting.   Patient states he has been constipated in the past 3 days.  Last bowel movement was on Friday.  He has abdominal distention, diffuse abdominal pain, nausea, vomiting, no diarrhea.  His abdominal pain is constant, aching, mild to moderate, nonradiating, not aggravated or alleviated by any known factors.  No fever or chills.  No symptoms of UTI.  No chest pain, cough, SOB.   Of note, patient was recently treated for sinus infection with amoxicillin  with improvement, then symptoms came back.  PCP started him on Levaquin  and prednisone  tapering on 5/28.  His symptoms have significantly improved.  He stopped taking these medication since he has abdominal distention and pain.  He does not want to restart prednisone  and Levaquin  today.  Patient states that he took Eliquis yesterday morning.   NG tube placed in ED.  Seen by general surgery on 6/3.  Patient remains high risk for surgical intervention but will attempt to manage conservatively for now.     Assessment & Plan:   Principal Problem:   SBO (small bowel obstruction) (HCC) Active Problems:   Chronic atrial fibrillation   HTN (hypertension)   Prediabetes   Recurrent sinusitis   Leukocytosis      SBO (small bowel obstruction) (HCC):  CT showed SBO with transition point in the left mid abdomen, and mild mesenteric edema in the region of small bowel dilatation. Consulted Dr. Cornel Diesel of surgery. Plan: Continue n.p.o. as well as NG tube management Surgery team planning gravity trial today Continue as needed pain and  nausea control Continue supplemental IV fluid Continue holding Eliquis Appreciate surgical follow-up   Chronic atrial fibrillation:  Heart rate 90s -IV as needed metoprolol 2.5 mg every 2 hour for heart rate> 125 - Continue home cardia - Continue to hold Eliquis in case patient needed surgery   HTN (hypertension) -IV hydralazine as needed Continue Cardia   Prediabetes: Recent A1c 5.7, well-controlled.  Patient is not taking medications. - Every morning CBG   Recurrent sinusitis: Symptoms have almost resolved.  Patient does not want to continue steroid and Levaquin . - Continue as needed Mucinex  and albuterol   Leukocytosis: WBC 19.2, no fever, likely due to recent steroid use. - No indication for antibiotics     DVT prophylaxis: SCD Code Status: Full Family Communication: Spouse and other family members at bedside 6/3 Disposition Plan: Status is: Inpatient Remains inpatient appropriate because: SBO   Level of care: Telemetry Medical   Consultants:  General surgery   Procedures:  NG tube placement   Antimicrobials: None     Subjective: Patient seen and examined in the presence of the wife Patient denies nausea vomiting worsening abdominal pain or chest pain NG tube in place   Examination:   General exam: Appears calm and comfortable  Respiratory system: Bibasilar crackles.  Normal work of breathing.  Room air Cardiovascular system: S1-S2, regular rate, irregular rhythm, no murmurs, no pedal edema Gastrointestinal system: Obese, mild distention, mild TTP left upper quadrant, diminished bowel sounds, NGT in place Central nervous system: Alert and oriented. No focal neurological deficits. Extremities: Symmetric 5  x 5 power. Skin: No rashes, lesions or ulcers Psychiatry: Judgement and insight appear normal. Mood & affect appropriate.        Data Reviewed:     Vitals:   04/07/24 1928 04/08/24 0347 04/08/24 0753 04/08/24 1519  BP: (!) 101/53 103/67 103/68  111/62  Pulse: 69 69 68 70  Resp: 20 20 16 18   Temp: 98.5 F (36.9 C) 98.4 F (36.9 C) 97.7 F (36.5 C) 98.2 F (36.8 C)  TempSrc: Oral Oral Oral Oral  SpO2: 95% 95% 98% 93%  Weight:      Height:          Latest Ref Rng & Units 04/08/2024    4:42 AM 04/06/2024    4:11 AM 04/05/2024    8:50 PM  CBC  WBC 4.0 - 10.5 K/uL 12.3  15.8  19.2   Hemoglobin 13.0 - 17.0 g/dL 16.1  09.6  04.5   Hematocrit 39.0 - 52.0 % 41.3  47.8  53.0   Platelets 150 - 400 K/uL 274  343  358        Latest Ref Rng & Units 04/08/2024    4:42 AM 04/06/2024    4:11 AM 04/05/2024    8:50 PM  BMP  Glucose 70 - 99 mg/dL 95  409  811   BUN 8 - 23 mg/dL 21  32  36   Creatinine 0.61 - 1.24 mg/dL 9.14  7.82  9.56   Sodium 135 - 145 mmol/L 138  136  137   Potassium 3.5 - 5.1 mmol/L 3.4  3.9  4.0   Chloride 98 - 111 mmol/L 103  99  98   CO2 22 - 32 mmol/L 27  26  24    Calcium 8.9 - 10.3 mg/dL 7.9  8.9  9.8      Author: Ezzard Holms, MD 04/08/2024 3:42 PM  For on call review [email or web address].

## 2024-04-08 NOTE — Progress Notes (Signed)
 BP low. Order received from Dr Mariella Shore to hold a.m. cardiac/ BP meds

## 2024-04-09 DIAGNOSIS — K56609 Unspecified intestinal obstruction, unspecified as to partial versus complete obstruction: Secondary | ICD-10-CM | POA: Diagnosis not present

## 2024-04-09 LAB — CBC WITH DIFFERENTIAL/PLATELET
Abs Immature Granulocytes: 0.15 10*3/uL — ABNORMAL HIGH (ref 0.00–0.07)
Basophils Absolute: 0.1 10*3/uL (ref 0.0–0.1)
Basophils Relative: 1 %
Eosinophils Absolute: 0.2 10*3/uL (ref 0.0–0.5)
Eosinophils Relative: 1 %
HCT: 41.7 % (ref 39.0–52.0)
Hemoglobin: 14.5 g/dL (ref 13.0–17.0)
Immature Granulocytes: 1 %
Lymphocytes Relative: 41 %
Lymphs Abs: 4.8 10*3/uL — ABNORMAL HIGH (ref 0.7–4.0)
MCH: 33.7 pg (ref 26.0–34.0)
MCHC: 34.8 g/dL (ref 30.0–36.0)
MCV: 97 fL (ref 80.0–100.0)
Monocytes Absolute: 1.2 10*3/uL — ABNORMAL HIGH (ref 0.1–1.0)
Monocytes Relative: 10 %
Neutro Abs: 5.3 10*3/uL (ref 1.7–7.7)
Neutrophils Relative %: 46 %
Platelets: 264 10*3/uL (ref 150–400)
RBC: 4.3 MIL/uL (ref 4.22–5.81)
RDW: 13.2 % (ref 11.5–15.5)
WBC: 11.6 10*3/uL — ABNORMAL HIGH (ref 4.0–10.5)
nRBC: 0 % (ref 0.0–0.2)

## 2024-04-09 LAB — BASIC METABOLIC PANEL WITH GFR
Anion gap: 6 (ref 5–15)
BUN: 18 mg/dL (ref 8–23)
CO2: 27 mmol/L (ref 22–32)
Calcium: 8 mg/dL — ABNORMAL LOW (ref 8.9–10.3)
Chloride: 105 mmol/L (ref 98–111)
Creatinine, Ser: 0.98 mg/dL (ref 0.61–1.24)
GFR, Estimated: >60 mL/min (ref >60)
Glucose, Bld: 86 mg/dL (ref 70–99)
Potassium: 3.8 mmol/L (ref 3.5–5.1)
Sodium: 138 mmol/L (ref 135–145)

## 2024-04-09 LAB — GLUCOSE, CAPILLARY: Glucose-Capillary: 98 mg/dL (ref 70–99)

## 2024-04-09 MED ORDER — BISACODYL 10 MG RE SUPP
10.0000 mg | Freq: Once | RECTAL | Status: AC
  Administered 2024-04-09: 10 mg via RECTAL
  Filled 2024-04-09: qty 1

## 2024-04-09 NOTE — Plan of Care (Signed)

## 2024-04-09 NOTE — Progress Notes (Signed)
 Progress Note   Patient: James Logan UEA:[id number] DOB: 05-26-1946 DOA: 04/05/2024     4 DOS: the patient was seen and examined on 04/09/2024    Brief Narrative:   78 y.o. male with medical history significant of hernia repair, HTN, HLD, prediabetes, childhood asthma, depression, BPH, GERD, A-fib on Eliquis, kidney stone, hiatal hernia, who presents with constipation, abdominal distention, abdominal pain, nausea, vomiting.   Patient states he has been constipated in the past 3 days.  Last bowel movement was on Friday.  He has abdominal distention, diffuse abdominal pain, nausea, vomiting, no diarrhea.  His abdominal pain is constant, aching, mild to moderate, nonradiating, not aggravated or alleviated by any known factors.  No fever or chills.  No symptoms of UTI.  No chest pain, cough, SOB.   Of note, patient was recently treated for sinus infection with amoxicillin  with improvement, then symptoms came back.  PCP started him on Levaquin  and prednisone  tapering on 5/28.  His symptoms have significantly improved.  He stopped taking these medication since he has abdominal distention and pain.  He does not want to restart prednisone  and Levaquin  today.  Patient states that he took Eliquis yesterday morning.   NG tube placed in ED.  Seen by general surgery on 6/3.  Patient remains high risk for surgical intervention but will attempt to manage conservatively for now.     Assessment & Plan:   Principal Problem:   SBO (small bowel obstruction) (HCC) Active Problems:   Chronic atrial fibrillation   HTN (hypertension)   Prediabetes   Recurrent sinusitis   Leukocytosis      SBO (small bowel obstruction) (HCC):  CT showed SBO with transition point in the left mid abdomen, and mild mesenteric edema in the region of small bowel dilatation. Consulted Dr. Cornel Diesel of surgery. Plan: Advancement of diet according to general surgery recs NG tube removed on 04/2024 Patient taking for gravity trial by  general surgery on 04/08/2024 Continue as needed pain and nausea control Continue supplemental IV fluid Continue holding Eliquis Appreciate surgical follow-up   Chronic atrial fibrillation:  Heart rate 90s -IV as needed metoprolol 2.5 mg every 2 hour for heart rate> 125 - Continue home cardia - Continue to hold Eliquis in case patient needed surgery   HTN (hypertension) -IV hydralazine as needed Continue Cardia   Prediabetes: Recent A1c 5.7, well-controlled.  Patient is not taking medications. - Every morning CBG   Recurrent sinusitis: Symptoms have almost resolved.  Patient does not want to continue steroid and Levaquin . - Continue as needed Mucinex  and albuterol   Leukocytosis: WBC 19.2, no fever, likely due to recent steroid use. - No indication for antibiotics     DVT prophylaxis: SCD Code Status: Full Family Communication: Spouse and other family members at bedside 6/3 Disposition Plan: Status is: Inpatient Remains inpatient appropriate because: SBO   Level of care: Telemetry Medical   Consultants:  General surgery   Procedures:  NG tube placement and removal   Antimicrobials: None     Subjective: Patient seen and examined in the presence of the wife Admits to remaining abdominal pain NG tube has been removed today  Examination:   General exam: Appears calm and comfortable  Respiratory system: Bibasilar crackles.  Normal work of breathing.  Room air Cardiovascular system: S1-S2, regular rate, irregular rhythm, no murmurs, no pedal edema Gastrointestinal system: Nontender mildly distended Central nervous system: Alert and oriented. No focal neurological deficits. Extremities: Symmetric 5 x 5 power. Skin: No  rashes, lesions or ulcers Psychiatry: Judgement and insight appear normal. Mood & affect appropriate.        Data Reviewed:     Vitals:   04/08/24 1940 04/09/24 0434 04/09/24 0823 04/09/24 1518  BP: 130/71 109/67 124/72 (!) 122/57  Pulse: 73  68 70 71  Resp: 20 16 19 19   Temp: 98.3 F (36.8 C) (!) 97.3 F (36.3 C) 97.8 F (36.6 C) 98 F (36.7 C)  TempSrc: Oral Oral Oral Oral  SpO2: 96% 98% 96% 95%  Weight:      Height:          Latest Ref Rng & Units 04/09/2024    4:42 AM 04/08/2024    4:42 AM 04/06/2024    4:11 AM  CBC  WBC 4.0 - 10.5 K/uL 11.6  12.3  15.8   Hemoglobin 13.0 - 17.0 g/dL 16.1  09.6  04.5   Hematocrit 39.0 - 52.0 % 41.7  41.3  47.8   Platelets 150 - 400 K/uL 264  274  343        Latest Ref Rng & Units 04/09/2024    4:42 AM 04/08/2024    4:42 AM 04/06/2024    4:11 AM  BMP  Glucose 70 - 99 mg/dL 86  95  409   BUN 8 - 23 mg/dL 18  21  32   Creatinine 0.61 - 1.24 mg/dL 8.11  9.14  7.82   Sodium 135 - 145 mmol/L 138  138  136   Potassium 3.5 - 5.1 mmol/L 3.8  3.4  3.9   Chloride 98 - 111 mmol/L 105  103  99   CO2 22 - 32 mmol/L 27  27  26    Calcium 8.9 - 10.3 mg/dL 8.0  7.9  8.9      Author: Ezzard Holms, MD 04/09/2024 4:56 PM  For on call review [email or web address].

## 2024-04-09 NOTE — Progress Notes (Signed)
 [name] SURGICAL PROGRESS NOTE (cpt [phone])  Hospital Day(s): 4.   Interval History: Patient seen and examined, no acute events or new complaints overnight. Patient reports he is doing well. No abdominal pain or distension. No fever, chills, nausea, emesis. Previous leukocytosis seen continues to improve; WBC 11.6K. Hgb to 14.5. Renal function normal; sCr - 0.98; UO - unmeasured. NGT removed after passing clamp trial.   Review of Systems:  Constitutional: denies fever, chills  HEENT: denies cough or congestion  Respiratory: denies any shortness of breath  Cardiovascular: denies chest pain or palpitations  Gastrointestinal: denied abdominal pain, denied nausea, emesis  Genitourinary: denies burning with urination or urinary frequency Musculoskeletal: denies pain, decreased motor or sensation  Vital signs in last 24 hours: [min-max] current  Temp:  [97.3 F (36.3 C)-98.3 F (36.8 C)] 97.3 F (36.3 C) (06/06 0434) Pulse Rate:  [68-73] 68 (06/06 0434) Resp:  [16-20] 16 (06/06 0434) BP: (103-130)/(62-71) 109/67 (06/06 0434) SpO2:  [93 %-98 %] 98 % (06/06 0434)     Height: 5\' 8"  (172.7 cm) Weight: 71.7 kg BMI (Calculated): 24.03   Intake/Output last 2 shifts:  06/05 0701 - 06/06 0700 In: 792.7 [P.O.:310; IV Piggyback:482.7] Out: -    Physical Exam:  Constitutional: alert, cooperative and no distress  HENT: normocephalic without obvious abnormality Eyes: PERRL, EOM's grossly intact and symmetric  Respiratory: breathing non-labored at rest  Cardiovascular: regular rate and sinus rhythm  Gastrointestinal: Soft, non-distended, he is not overtly tender, no rebound/guarding  Musculoskeletal: no edema or wounds, motor and sensation grossly intact, NT    Labs:     Latest Ref Rng & Units 04/09/2024    4:42 AM 04/08/2024    4:42 AM 04/06/2024    4:11 AM  CBC  WBC 4.0 - 10.5 K/uL 11.6  12.3  15.8   Hemoglobin 13.0 - 17.0 g/dL 28.4  13.2  44.0   Hematocrit 39.0 - 52.0 %  41.7  41.3  47.8   Platelets 150 - 400 K/uL 264  274  343       Latest Ref Rng & Units 04/09/2024    4:42 AM 04/08/2024    4:42 AM 04/06/2024    4:11 AM  CMP  Glucose 70 - 99 mg/dL 86  95  102   BUN 8 - 23 mg/dL 18  21  32   Creatinine 0.61 - 1.24 mg/dL 7.25  3.66  4.40   Sodium 135 - 145 mmol/L 138  138  136   Potassium 3.5 - 5.1 mmol/L 3.8  3.4  3.9   Chloride 98 - 111 mmol/L 105  103  99   CO2 22 - 32 mmol/L 27  27  26    Calcium 8.9 - 10.3 mg/dL 8.0  7.9  8.9      Imaging studies:  No new imaging studies    Assessment/Plan: (ICD-10's: K19.609) 78 y.o. 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               - We can do FLD this morning for breakfast. Okay for soft diet for lunch             - No need for emergent surgical intervention given his clinical progression. - Monitor abdominal examination; on-going bowel function  - Pain control prn; antiemetics prn             - Further management  per primary service   - Discharge Planning: Diet advancing today and patient passing flatus. Nothing more from surgical perspective. He wishes to stay until he has a BM which is reasonable; anticipate okay for DC in the AM tomorrow (06/07). We will follow peripherally for now. He does NOT need surgical follow up.   All of the above findings and recommendations were discussed with the patient, patient's family at bedside, and the medical team, and all of patient's and family's questions were answered to their expressed satisfaction.  -- James Bay, PA-C [facility] 04/09/2024, 7:15 AM M-F: 7am - 4pm

## 2024-04-10 DIAGNOSIS — K56609 Unspecified intestinal obstruction, unspecified as to partial versus complete obstruction: Secondary | ICD-10-CM | POA: Diagnosis not present

## 2024-04-10 LAB — CBC WITH DIFFERENTIAL/PLATELET
Abs Immature Granulocytes: 0.12 10*3/uL — ABNORMAL HIGH (ref 0.00–0.07)
Basophils Absolute: 0.1 10*3/uL (ref 0.0–0.1)
Basophils Relative: 0 %
Eosinophils Absolute: 0.1 10*3/uL (ref 0.0–0.5)
Eosinophils Relative: 1 %
HCT: 43.9 % (ref 39.0–52.0)
Hemoglobin: 15.7 g/dL (ref 13.0–17.0)
Immature Granulocytes: 1 %
Lymphocytes Relative: 35 %
Lymphs Abs: 3.9 10*3/uL (ref 0.7–4.0)
MCH: 33.8 pg (ref 26.0–34.0)
MCHC: 35.8 g/dL (ref 30.0–36.0)
MCV: 94.4 fL (ref 80.0–100.0)
Monocytes Absolute: 1 10*3/uL (ref 0.1–1.0)
Monocytes Relative: 9 %
Neutro Abs: 6 10*3/uL (ref 1.7–7.7)
Neutrophils Relative %: 54 %
Platelets: 274 10*3/uL (ref 150–400)
RBC: 4.65 MIL/uL (ref 4.22–5.81)
RDW: 13.1 % (ref 11.5–15.5)
WBC: 11.2 10*3/uL — ABNORMAL HIGH (ref 4.0–10.5)
nRBC: 0 % (ref 0.0–0.2)

## 2024-04-10 LAB — BASIC METABOLIC PANEL WITH GFR
Anion gap: 8 (ref 5–15)
BUN: 20 mg/dL (ref 8–23)
CO2: 22 mmol/L (ref 22–32)
Calcium: 8.5 mg/dL — ABNORMAL LOW (ref 8.9–10.3)
Chloride: 105 mmol/L (ref 98–111)
Creatinine, Ser: 0.94 mg/dL (ref 0.61–1.24)
GFR, Estimated: >60 mL/min (ref >60)
Glucose, Bld: 116 mg/dL — ABNORMAL HIGH (ref 70–99)
Potassium: 3.7 mmol/L (ref 3.5–5.1)
Sodium: 135 mmol/L (ref 135–145)

## 2024-04-10 LAB — GLUCOSE, CAPILLARY: Glucose-Capillary: 114 mg/dL — ABNORMAL HIGH (ref 70–99)

## 2024-04-10 MED ORDER — CYCLOBENZAPRINE HCL 5 MG PO TABS: 5.0000 mg | ORAL_TABLET | Freq: Three times a day (TID) | ORAL | 0 refills | Status: AC | PRN

## 2024-04-10 MED ORDER — CYCLOBENZAPRINE HCL 10 MG PO TABS: 5.0000 mg | ORAL_TABLET | Freq: Three times a day (TID) | ORAL | Status: DC | PRN

## 2024-04-10 MED ORDER — ALUM & MAG HYDROXIDE-SIMETH 200-200-20 MG/5ML PO SUSP
15.0000 mL | Freq: Four times a day (QID) | ORAL | Status: DC | PRN
Start: 2024-04-10 — End: 2024-04-10
  Administered 2024-04-10: 15 mL via ORAL
  Filled 2024-04-10: qty 30

## 2024-04-10 NOTE — Plan of Care (Signed)

## 2024-04-10 NOTE — TOC Transition Note (Signed)
 Transition of Care [facility]) - Discharge Note   Patient Details  Name: James Logan MRN: [id number] Date of Birth: 1946-05-08  Transition of Care [facility]) CM/SW Contact:  Alexandra Ice, RN Phone Number: 04/10/2024, 11:14 AM   Clinical Narrative:     Patient has discharge order in place. Patient to return home. No TOC needs identified at this time.   Final next level of care: Home/Self Care Barriers to Discharge: Barriers Resolved   Patient Goals and CMS Choice            Discharge Placement                  Name of family member notified: Spouse, Gregary Lean Patient and family notified of of transfer: 04/10/24  Discharge Plan and Services Additional resources added to the After Visit Summary for                    DME Agency: NA       HH Arranged: NA          Social Drivers of Health (SDOH) Interventions SDOH Screenings   Food Insecurity: No Food Insecurity (04/06/2024)  Housing: Low Risk  (04/06/2024)  Transportation Needs: No Transportation Needs (04/06/2024)  Utilities: Not At Risk (04/06/2024)  Depression (PHQ2-9): Low Risk  (11/17/2018)  Financial Resource Strain: Low Risk  (12/16/2023)   Received from [facility]  Social Connections: Socially Integrated (04/06/2024)  Tobacco Use: Medium Risk (04/05/2024)     Readmission Risk Interventions     No data to display

## 2024-04-10 NOTE — Discharge Summary (Signed)
 Physician Discharge Summary   Patient: James Logan MRN: [id number] DOB: October 10, 1946  Admit date:     04/05/2024  Discharge date: 04/10/24  Discharge Physician: James Logan   PCP: James Moulding, MD   Recommendations at discharge:  Follow-up with PCP  Discharge Diagnoses:   SBO (small bowel obstruction) (HCC):  Chronic atrial fibrillation:  HTN (hypertension) Prediabetes:  Recurrent sinusitis:  Leukocytosis:   Hospital Course: 78 y.o. male with medical history significant of hernia repair, HTN, HLD, prediabetes, childhood asthma, depression, BPH, GERD, A-fib on Eliquis, kidney stone, hiatal hernia, who presents with constipation, abdominal distention, abdominal pain, nausea, vomiting. Patient was noted to have small bowel obstruction.  Was managed conservatively by general surgery team with improvement in symptoms and have therefore been cleared today for discharge to follow-up with PCP  Consultants: General Surgery Procedures performed: None Disposition: Home Diet recommendation:  Cardiac diet DISCHARGE MEDICATION: Allergies as of 04/10/2024   No Known Allergies      Medication List     STOP taking these medications    cetirizine 10 MG tablet Commonly known as: ZYRTEC   chlorpheniramine-HYDROcodone  10-8 MG/5ML Suer Commonly known as: Tussionex Pennkinetic ER   diazepam  5 MG tablet Commonly known as: Valium    fluticasone  50 MCG/ACT nasal spray Commonly known as: Flonase    ivermectin  3 MG Tabs tablet Commonly known as: STROMECTOL    levofloxacin  500 MG tablet Commonly known as: LEVAQUIN    lisinopril 10 MG tablet Commonly known as: ZESTRIL   meloxicam  15 MG tablet Commonly known as: MOBIC    predniSONE  10 MG (21) Tbpk tablet Commonly known as: STERAPRED UNI-PAK 21 TAB   predniSONE  50 MG tablet Commonly known as: DELTASONE    terbinafine 250 MG tablet Commonly known as: LAMISIL   zaleplon 5 MG capsule Commonly known as: SONATA        TAKE these medications    acetaminophen  325 MG tablet Commonly known as: TYLENOL  Take 650 mg by mouth every 6 (six) hours as needed.   cyclobenzaprine  5 MG tablet Commonly known as: FLEXERIL  Take 1 tablet (5 mg total) by mouth 3 (three) times daily as needed for muscle spasms.   diltiazem  120 MG 24 hr capsule Commonly known as: CARDIZEM  CD Take 120 mg by mouth daily.   Eliquis 5 MG Tabs tablet Generic drug: apixaban Take 5 mg by mouth 2 (two) times daily.   esomeprazole 10 MG packet Commonly known as: NEXIUM Take 10 mg by mouth daily before breakfast.   fexofenadine 180 MG tablet Commonly known as: ALLEGRA Take 180 mg by mouth daily.   losartan -hydrochlorothiazide  100-12.5 MG tablet Commonly known as: HYZAAR Take 1 tablet by mouth daily.        Follow-up Information     [facility] Follow up.   Specialty: General Surgery Why: As needed Contact information: [address] [place]  [address] [phone]               Discharge Exam: James Logan Weights   04/05/24 2043  Weight: 71.7 kg   General exam: Appears calm and comfortable  Respiratory system: Bibasilar crackles.  Normal work of breathing.  Room air Cardiovascular system: S1-S2, regular rate, irregular rhythm, no murmurs, no pedal edema Gastrointestinal system: Nontender mildly distended Central nervous system: Alert and oriented. No focal neurological deficits. Extremities: Symmetric 5 x 5 power. Skin: No rashes, lesions or ulcers Psychiatry: Judgement and insight appear normal. Mood & affect appropriate.   Condition at discharge:  good  The results of significant diagnostics from this hospitalization (including imaging, microbiology, ancillary and laboratory) are listed below for reference.   Imaging Studies: DG ABD ACUTE 2+V W 1V CHEST Result Date: 04/07/2024 CLINICAL DATA:  [id number] Small bowel obstruction (HCC) [id number]  EXAM: DG ABDOMEN ACUTE WITH 1 VIEW CHEST COMPARISON:  04/06/2024 FINDINGS: Lungs are clear.  No effusion. Heart size normal.  Aortic Atherosclerosis (ICD10-170.0). Gastric tube to the decompressed stomach. No free air. Small bowel decompressed. Paucity of colonic gas. Left hip arthroplasty. Bilateral shoulder and right hip DJD. Cervical fixation hardware, partially visualized. Lumbar spondylitic change. IMPRESSION: 1. No acute findings. 2. Gastric tube to the decompressed stomach. Electronically Signed   By: Nicoletta Barrier M.D.   On: 04/07/2024 07:59   DG Abd Portable 1 View Result Date: 04/06/2024 EXAM: 1 VIEW XRAY OF THE ABDOMEN SUPINE 04/06/2024 12:13:07 AM COMPARISON: None available. CLINICAL HISTORY: NG tube placement. FINDINGS: BOWEL: Nonobstructive bowel gas pattern. PERITONEUM AND SOFT TISSUES: No abnormal calcifications. BONES: No acute osseous abnormality. LINES AND TUBES: Enteric tube terminates in the proximal gastric body with its side port at/above the GE junction. Advancement 5 cm is suggested. IMPRESSION: 1. Enteric tube terminates in the proximal gastric body with its side port at/above the GE junction. Advancement 5 cm is suggested. Electronically signed by: Zadie Herter MD 04/06/2024 12:16 AM EDT RP Workstation: [id number]   CT ABDOMEN PELVIS W CONTRAST Result Date: 04/05/2024 CLINICAL DATA:  Vomiting. Bloating, constipation, pain. Evaluate for bowel obstruction. EXAM: CT ABDOMEN AND PELVIS WITH CONTRAST TECHNIQUE: Multidetector CT imaging of the abdomen and pelvis was performed using the standard protocol following bolus administration of intravenous contrast. RADIATION DOSE REDUCTION: This exam was performed according to the departmental dose-optimization program which includes automated exposure control, adjustment of the mA and/or kV according to patient size and/or use of iterative reconstruction technique. CONTRAST:  OMNIPAQUE  IOHEXOL  300 MG/ML  SOLN COMPARISON:  CT 10/10/2017  FINDINGS: Lower chest: The heart is mildly enlarged. No basilar airspace disease or pleural effusion. Hepatobiliary: Mild diffuse hepatic steatosis. No focal liver lesion. Punctate gallstones, series 2, image 32. No pericholecystic inflammation or abnormal gallbladder distension. No biliary dilatation. Pancreas: Mild fatty atrophy.  No ductal dilatation or inflammation. Spleen: Splenectomy. Adrenals/Urinary Tract: No adrenal nodule. Nonobstructing 7 mm stone in the lower left kidney. No hydronephrosis. No renal inflammation. Partially distended urinary bladder, normal for degree of distension. Stomach/Bowel: Dilated fluid-filled stomach. Dilated fluid-filled proximal small bowel measuring up to 4.3 cm diameter. Transition point from dilated to nondilated in the left mid abdomen, series 5, image 18. The small bowel distal to this is decompressed. Mild mesenteric edema in the region of small bowel dilatation. No pneumatosis. The appendix is normal. Scattered colonic diverticula without diverticulitis. Small volume of formed stool in the colon. Vascular/Lymphatic: Aortic atherosclerosis. No aortic aneurysm. Portal vein is patent. No portal venous or mesenteric gas. No lymphadenopathy. Reproductive: Prominent prostate spans 4.6 cm. Other: No free air or ascites. Minimal fat in the inguinal canals. Post abdominal wall hernia repair. Small peripherally calcified structure deep to the area of surgery is likely an area of prior fat necrosis. Musculoskeletal: The bones are subjectively under mineralized. Moderate right hip osteoarthritis. Left hip arthroplasty. No acute osseous findings. IMPRESSION: 1. Small-bowel obstruction with transition point in the left mid abdomen. Mild mesenteric edema in the region of small bowel dilatation. No pneumatosis or free air. 2. Hepatic steatosis. 3. Cholelithiasis without cholecystitis. 4. Nonobstructing left renal stone. 5. Colonic diverticulosis without  diverticulitis. Aortic  Atherosclerosis (ICD10-I70.0). Electronically Signed   By: Chadwick Colonel M.D.   On: 04/05/2024 22:35   DG Abdomen 1 View Result Date: 04/05/2024 CLINICAL DATA:  Constipation EXAM: ABDOMEN - 1 VIEW COMPARISON:  None Available. FINDINGS: Nonobstructive bowel gas pattern. Moderate to large volume stool. No organomegaly. 7 mm calculus overlies the expected lower pole of the left kidney. Rounded calculus within right mid abdomen corresponds to a extraluminal calculus within mesenteric fat on prior CT examination of 10/10/2017. Degenerative changes are seen within the lumbar spine right hip. Left total hip arthroplasty has been performed. IMPRESSION: 1. Moderate to large volume stool. 2. 7 mm left nephrolithiasis. Electronically Signed   By: Worthy Heads M.D.   On: 04/05/2024 21:13    Microbiology: Results for orders placed or performed during the hospital encounter of 09/07/13  Surgical pcr screen     Status: Abnormal   Collection Time: 09/07/13 12:43 PM   Specimen: Nasal Mucosa; Nasal Swab  Result Value Ref Range Status   MRSA, PCR NEGATIVE NEGATIVE Final   Staphylococcus aureus POSITIVE (A) NEGATIVE Final    Comment:        The Xpert SA Assay (FDA approved for NASAL specimens in patients over 82 years of age), is one component of a comprehensive surveillance program.  Test performance has been validated by [employer] for patients greater than or equal to 57 year old. It is not intended to diagnose infection nor to guide or monitor treatment.    Labs: CBC: Recent Labs  Lab 04/05/24 2050 04/06/24 0411 04/08/24 0442 04/09/24 0442 04/10/24 0544  WBC 19.2* 15.8* 12.3* 11.6* 11.2*  NEUTROABS  --   --  6.5 5.3 6.0  HGB 19.1* 16.8 14.8 14.5 15.7  HCT 53.0* 47.8 41.3 41.7 43.9  MCV 93.8 94.1 95.8 97.0 94.4  PLT 358 343 274 264 274   Basic Metabolic Panel: Recent Labs  Lab 04/05/24 2050 04/06/24 0411 04/08/24 0442 04/09/24 0442 04/10/24 0544  NA 137 136 138 138 135  K  4.0 3.9 3.4* 3.8 3.7  CL 98 99 103 105 105  CO2 24 26 27 27 22   GLUCOSE 163* 125* 95 86 116*  BUN 36* 32* 21 18 20   CREATININE 1.20 0.98 0.95 0.98 0.94  CALCIUM 9.8 8.9 7.9* 8.0* 8.5*   Liver Function Tests: Recent Labs  Lab 04/05/24 2050  AST 25  ALT 28  ALKPHOS 59  BILITOT 1.4*  PROT 8.1  ALBUMIN 4.5   CBG: Recent Labs  Lab 04/07/24 0750 04/08/24 0758 04/09/24 0829 04/10/24 0814  GLUCAP 77 111* 98 114*    Discharge time spent:  35 minutes.  Signed: Ezzard Holms, MD [facility] 04/10/2024
